# Patient Record
Sex: Male | Born: 1990 | Race: White | Hispanic: No | Marital: Single | Smoking: Never smoker
Health system: Southern US, Community
[De-identification: ages and names within clinical notes are randomized; demographics above are authoritative.]

## PROBLEM LIST (undated history)

## (undated) DIAGNOSIS — K5100 Ulcerative (chronic) pancolitis without complications: Secondary | ICD-10-CM

---

## 2017-12-30 ENCOUNTER — Other Ambulatory Visit: Payer: Self-pay

## 2017-12-30 ENCOUNTER — Emergency Department (HOSPITAL_COMMUNITY): Payer: Self-pay

## 2017-12-30 ENCOUNTER — Inpatient Hospital Stay (HOSPITAL_COMMUNITY)
Admission: EM | Admit: 2017-12-30 | Discharge: 2018-01-02 | DRG: 872 | Disposition: A | Discharge status: Home / Self Care | Payer: Self-pay | Attending: Internal Medicine | Admitting: Internal Medicine

## 2017-12-30 ENCOUNTER — Encounter (HOSPITAL_COMMUNITY): Payer: Self-pay

## 2017-12-30 DIAGNOSIS — E871 Hypo-osmolality and hyponatremia: Secondary | ICD-10-CM | POA: Diagnosis present

## 2017-12-30 DIAGNOSIS — K921 Melena: Secondary | ICD-10-CM | POA: Diagnosis not present

## 2017-12-30 DIAGNOSIS — D509 Iron deficiency anemia, unspecified: Secondary | ICD-10-CM | POA: Diagnosis present

## 2017-12-30 DIAGNOSIS — D649 Anemia, unspecified: Secondary | ICD-10-CM

## 2017-12-30 DIAGNOSIS — K5190 Ulcerative colitis, unspecified, without complications: Secondary | ICD-10-CM

## 2017-12-30 DIAGNOSIS — K5100 Ulcerative (chronic) pancolitis without complications: Secondary | ICD-10-CM

## 2017-12-30 DIAGNOSIS — A419 Sepsis, unspecified organism: Principal | ICD-10-CM | POA: Diagnosis present

## 2017-12-30 DIAGNOSIS — D473 Essential (hemorrhagic) thrombocythemia: Secondary | ICD-10-CM

## 2017-12-30 DIAGNOSIS — D75839 Thrombocytosis, unspecified: Secondary | ICD-10-CM

## 2017-12-30 DIAGNOSIS — E860 Dehydration: Secondary | ICD-10-CM | POA: Diagnosis present

## 2017-12-30 DIAGNOSIS — D62 Acute posthemorrhagic anemia: Secondary | ICD-10-CM | POA: Diagnosis present

## 2017-12-30 DIAGNOSIS — K51018 Ulcerative (chronic) pancolitis with other complication: Secondary | ICD-10-CM | POA: Diagnosis present

## 2017-12-30 HISTORY — DX: Ulcerative (chronic) pancolitis without complications: K51.00

## 2017-12-30 LAB — COMPREHENSIVE METABOLIC PANEL
ALBUMIN: 3.3 g/dL — AB (ref 3.5–5.0)
ALK PHOS: 89 U/L (ref 38–126)
ALT: 14 U/L (ref 0–44)
AST: 14 U/L — AB (ref 15–41)
Anion gap: 11 (ref 5–15)
BUN: 9 mg/dL (ref 6–20)
CALCIUM: 9.4 mg/dL (ref 8.9–10.3)
CHLORIDE: 100 mmol/L (ref 98–111)
CO2: 21 mmol/L — AB (ref 22–32)
CREATININE: 0.99 mg/dL (ref 0.61–1.24)
GFR calc non Af Amer: >60 mL/min (ref >60)
GLUCOSE: 103 mg/dL — AB (ref 70–99)
Potassium: 4.3 mmol/L (ref 3.5–5.1)
SODIUM: 132 mmol/L — AB (ref 135–145)
Total Bilirubin: 0.5 mg/dL (ref 0.3–1.2)
Total Protein: 9.4 g/dL — ABNORMAL HIGH (ref 6.5–8.1)

## 2017-12-30 LAB — CBC WITH DIFFERENTIAL/PLATELET
Abs Immature Granulocytes: 0 10*3/uL (ref 0.00–0.07)
BASOS ABS: 0 10*3/uL (ref 0.0–0.1)
Basophils Relative: 0 %
EOS ABS: 0 10*3/uL (ref 0.0–0.5)
EOS PCT: 0 %
HEMATOCRIT: 32 % — AB (ref 39.0–52.0)
HEMOGLOBIN: 8.2 g/dL — AB (ref 13.0–17.0)
LYMPHS ABS: 1.4 10*3/uL (ref 0.7–4.0)
Lymphocytes Relative: 8 %
MCH: 17.4 pg — ABNORMAL LOW (ref 26.0–34.0)
MCHC: 25.6 g/dL — ABNORMAL LOW (ref 30.0–36.0)
MCV: 67.9 fL — AB (ref 80.0–100.0)
MONOS PCT: 7 %
Monocytes Absolute: 1.2 10*3/uL — ABNORMAL HIGH (ref 0.1–1.0)
NRBC: 0 % (ref 0.0–0.2)
NRBC: 0 /100{WBCs}
Neutro Abs: 15.1 10*3/uL — ABNORMAL HIGH (ref 1.7–7.7)
Neutrophils Relative %: 85 %
Platelets: 1393 10*3/uL (ref 150–400)
RBC: 4.71 MIL/uL (ref 4.22–5.81)
RDW: 18.3 % — ABNORMAL HIGH (ref 11.5–15.5)
WBC: 17.8 10*3/uL — ABNORMAL HIGH (ref 4.0–10.5)

## 2017-12-30 LAB — URINALYSIS, ROUTINE W REFLEX MICROSCOPIC
Bilirubin Urine: NEGATIVE
GLUCOSE, UA: NEGATIVE mg/dL
Hgb urine dipstick: NEGATIVE
Ketones, ur: 5 mg/dL — AB
LEUKOCYTES UA: NEGATIVE
NITRITE: NEGATIVE
PH: 5 (ref 5.0–8.0)
Protein, ur: NEGATIVE mg/dL
Specific Gravity, Urine: >1.046 — ABNORMAL HIGH (ref 1.005–1.030)

## 2017-12-30 LAB — C-REACTIVE PROTEIN: CRP: 19.8 mg/dL — ABNORMAL HIGH (ref <1.0)

## 2017-12-30 LAB — POC OCCULT BLOOD, ED: Fecal Occult Bld: NEGATIVE

## 2017-12-30 LAB — I-STAT CG4 LACTIC ACID, ED: Lactic Acid, Venous: 1.51 mmol/L (ref 0.5–1.9)

## 2017-12-30 MED ORDER — IOHEXOL 300 MG/ML  SOLN
100.0000 mL | Freq: Once | INTRAMUSCULAR | Status: AC | PRN
  Administered 2017-12-30: 100 mL via INTRAVENOUS

## 2017-12-30 MED ORDER — LACTATED RINGERS IV BOLUS
1000.0000 mL | Freq: Once | INTRAVENOUS | Status: AC
  Administered 2017-12-30: 1000 mL via INTRAVENOUS

## 2017-12-30 MED ORDER — ONDANSETRON HCL 4 MG/2ML IJ SOLN
4.0000 mg | Freq: Once | INTRAMUSCULAR | Status: AC
  Administered 2017-12-30: 4 mg via INTRAVENOUS
  Filled 2017-12-30: qty 2

## 2017-12-30 MED ORDER — FERROUS SULFATE 325 (65 FE) MG PO TABS
325.0000 mg | ORAL_TABLET | Freq: Every day | ORAL | Status: DC
  Administered 2017-12-31: 325 mg via ORAL
  Filled 2017-12-30: qty 1

## 2017-12-30 MED ORDER — SODIUM CHLORIDE 0.9 % IV SOLN
INTRAVENOUS | Status: AC
  Administered 2017-12-30: 23:00:00 via INTRAVENOUS

## 2017-12-30 MED ORDER — ACETAMINOPHEN 650 MG RE SUPP: 650.0000 mg | Freq: Four times a day (QID) | RECTAL | Status: DC | PRN

## 2017-12-30 MED ORDER — ENOXAPARIN SODIUM 40 MG/0.4ML ~~LOC~~ SOLN
40.0000 mg | SUBCUTANEOUS | Status: DC
  Administered 2017-12-30 – 2018-01-01 (×3): 40 mg via SUBCUTANEOUS
  Filled 2017-12-30 (×3): qty 0.4

## 2017-12-30 MED ORDER — PIPERACILLIN-TAZOBACTAM 3.375 G IVPB
3.3750 g | Freq: Three times a day (TID) | INTRAVENOUS | Status: AC
  Administered 2017-12-30 – 2018-01-01 (×7): 3.375 g via INTRAVENOUS
  Filled 2017-12-30 (×7): qty 50

## 2017-12-30 MED ORDER — METHYLPREDNISOLONE SODIUM SUCC 40 MG IJ SOLR
40.0000 mg | Freq: Two times a day (BID) | INTRAMUSCULAR | Status: DC
  Administered 2017-12-30 – 2018-01-01 (×4): 40 mg via INTRAVENOUS
  Filled 2017-12-30 (×4): qty 1

## 2017-12-30 MED ORDER — PIPERACILLIN-TAZOBACTAM 4.5 G IVPB
4.5000 g | Freq: Once | INTRAVENOUS | Status: AC
  Administered 2017-12-30: 4.5 g via INTRAVENOUS
  Filled 2017-12-30: qty 100

## 2017-12-30 MED ORDER — MULTIVITAL PO TABS: ORAL_TABLET | Freq: Every day | ORAL | Status: DC

## 2017-12-30 MED ORDER — HYDROMORPHONE HCL 1 MG/ML IJ SOLN: 1.0000 mg | Freq: Once | INTRAMUSCULAR | Status: DC

## 2017-12-30 MED ORDER — ACETAMINOPHEN 325 MG PO TABS: 650.0000 mg | ORAL_TABLET | Freq: Four times a day (QID) | ORAL | Status: DC | PRN

## 2017-12-30 MED ORDER — FENTANYL CITRATE (PF) 100 MCG/2ML IJ SOLN
50.0000 ug | Freq: Once | INTRAMUSCULAR | Status: AC
  Administered 2017-12-30: 50 ug via INTRAVENOUS
  Filled 2017-12-30: qty 2

## 2017-12-30 NOTE — H&P (Signed)
History and Physical    [name] FYT:[id number] DOB: 01-Sep-1990 DOA: 12/30/2017  PCP: Patient, No Pcp Per Patient coming from: Home  Chief Complaint: Abdominal pain  HPI: Richard Estrada is a 27 y.o. male with medical history significant of ulcerative colitis presenting to the hospital for evaluation of abdominal pain.  Patient states he recently moved to [place] from [place] about 2.5 weeks ago.  He has previously seen gastroenterology in [place] and taken mesalamine in the past.  Currently not on any treatment for his ulcerative colitis.  He is now presenting with a 2 to 3-day history of left lower quadrant abdominal pain.  States the pain is dull and intermittently stabbing when he lays down or changes position in bed.  Currently his pain level is 2 out of 10.  He denies having any fevers, chills, nausea, or vomiting.  Denies having any melena or hematochezia.  States he is able to tolerate p.o. intake.  Denies having any fevers, sweats, unintentional weight loss, or fatigue.  Denies having any flushing, pruritus, or symptoms of erythromelalgia.  Denies history of blood clots.  He is not a smoker.  Reports having chronic anemia for which he takes an iron supplement.  ED Course: Temperature 100.7 F, tachycardic, tachypneic, blood pressure stable, and satting well on room air.  White count 17.8.  Lactic acid normal.  Hemoglobin 8.2 with MCV 67.  FOBT negative.  Platelet count 1,393,000.  Sodium 132.  LFTs normal.  UA not suggestive of infection. Urine culture pending.  Blood culture x2 pending.  CT abdomen pelvis showing pancolitis and mild terminal ileitis from infectious versus inflammatory etiology.  Pancolitis is most severe in the descending and sigmoid colon with severe surrounding inflammatory changes.  Chest x-ray showing no active disease.  Review of Systems: As per HPI otherwise 10 point review of systems negative.  Past Medical History:  Diagnosis Date  . Ulcerative  (chronic) enterocolitis ([place])     History reviewed. No pertinent surgical history.   reports that he has never smoked. He has never used smokeless tobacco. He reports that he drinks alcohol. He reports that he does not use drugs.  No Known Allergies  No family history on file.  Prior to Admission medications   Medication Sig Start Date End Date Taking? Authorizing Provider  IRON PO Take 1 tablet by mouth daily.   Yes [provider]  Multiple Vitamins-Minerals (MULTIVITAL PO) Take 1 tablet by mouth daily.   Yes [provider]    Physical Exam: Vitals:   12/30/17 1700 12/30/17 1804 12/30/17 2112 12/31/17 0038  BP: 110/73 119/80 118/75 107/64  Pulse: (!) 122 (!) 116 (!) 109 (!) 106  Resp: 18 (!) _0 Temp:  99.8 F (37.7 C) 98.4 F (36.9 C) 99.1 F (37.3 C)  TempSrc:  Oral Oral Oral  SpO2: 100% 99% 100% 99%  Weight:   67.2 kg   Height:   6' (1.829 m)     Physical Exam  Constitutional: He is oriented to person, place, and time. He appears well-developed and well-nourished. No distress.  HENT:  Head: Normocephalic.  Moist mucous membranes  Eyes: Right eye exhibits no discharge. Left eye exhibits no discharge.  Neck: Neck supple. No tracheal deviation present.  Cardiovascular: Normal rate, regular rhythm and intact distal pulses.  Pulmonary/Chest: Effort normal and breath sounds normal. No respiratory distress. He has no wheezes. He has no rales.  Abdominal: Soft. Bowel sounds are normal. He exhibits no distension.  There is tenderness. There is no rebound and no guarding.  Left lower quadrant mildly tender to palpation  Musculoskeletal: He exhibits no edema.  Neurological: He is alert and oriented to person, place, and time.  Skin: Skin is warm and dry. He is not diaphoretic.  Psychiatric: He has a normal mood and affect. His behavior is normal.     Labs on Admission: I have personally reviewed following labs and imaging studies  CBC: Recent  Labs  Lab 12/30/17 1556  WBC 17.8*  NEUTROABS 15.1*  HGB 8.2*  HCT 32.0*  MCV 67.9*  PLT 8,295*   Basic Metabolic Panel: Recent Labs  Lab 12/30/17 1556  NA 132*  K 4.3  CL 100  CO2 21*  GLUCOSE 103*  BUN 9  CREATININE 0.99  CALCIUM 9.4   GFR: Estimated Creatinine Clearance: 107.5 mL/min (by C-G formula based on SCr of 0.99 mg/dL). Liver Function Tests: Recent Labs  Lab 12/30/17 1556  AST 14*  ALT 14  ALKPHOS 89  BILITOT 0.5  PROT 9.4*  ALBUMIN 3.3*   No results for input(s): LIPASE, AMYLASE in the last 168 hours. No results for input(s): AMMONIA in the last 168 hours. Coagulation Profile: No results for input(s): INR, PROTIME in the last 168 hours. Cardiac Enzymes: No results for input(s): CKTOTAL, CKMB, CKMBINDEX, TROPONINI in the last 168 hours. BNP (last 3 results) No results for input(s): PROBNP in the last 8760 hours. HbA1C: No results for input(s): HGBA1C in the last 72 hours. CBG: No results for input(s): GLUCAP in the last 168 hours. Lipid Profile: No results for input(s): CHOL, HDL, LDLCALC, TRIG, CHOLHDL, LDLDIRECT in the last 72 hours. Thyroid Function Tests: No results for input(s): TSH, T4TOTAL, FREET4, T3FREE, THYROIDAB in the last 72 hours. Anemia Panel: No results for input(s): VITAMINB12, FOLATE, FERRITIN, TIBC, IRON, RETICCTPCT in the last 72 hours. Urine analysis:    Component Value Date/Time   COLORURINE YELLOW 12/30/2017 2246   APPEARANCEUR CLEAR 12/30/2017 2246   LABSPEC >1.046 (H) 12/30/2017 2246   PHURINE 5.0 12/30/2017 2246   GLUCOSEU NEGATIVE 12/30/2017 2246   HGBUR NEGATIVE 12/30/2017 2246   BILIRUBINUR NEGATIVE 12/30/2017 2246   KETONESUR 5 (A) 12/30/2017 2246   PROTEINUR NEGATIVE 12/30/2017 2246   NITRITE NEGATIVE 12/30/2017 2246   LEUKOCYTESUR NEGATIVE 12/30/2017 2246    Radiological Exams on Admission: Ct Abdomen Pelvis W Contrast  Result Date: 12/30/2017 CLINICAL DATA:  Cough for 1 week EXAM: CT ABDOMEN AND  PELVIS WITH CONTRAST TECHNIQUE: Multidetector CT imaging of the abdomen and pelvis was performed using the standard protocol following bolus administration of intravenous contrast. CONTRAST:  191m OMNIPAQUE IOHEXOL 300 MG/ML  SOLN COMPARISON:  None. FINDINGS: Lower chest: No acute abnormality. Hepatobiliary: No focal liver abnormality is seen. No gallstones, gallbladder wall thickening, or biliary dilatation. Pancreas: Unremarkable. No pancreatic ductal dilatation or surrounding inflammatory changes. Spleen: Normal in size without focal abnormality. Adrenals/Urinary Tract: Adrenal glands are unremarkable. Kidneys are normal, without renal calculi, focal lesion, or hydronephrosis. Bladder is unremarkable. Stomach/Bowel: Normal stomach. No pneumatosis, pneumoperitoneum or portal venous gas. Severe bowel wall thickening involving the descending and sigmoid colon with surrounding inflammatory changes. Bowel wall thickening involving the remainder of the colon. Air-fluid levels within the colon. Mild bowel wall thickening and distention of the terminal ileum. Mild thickening of appendix likely reactive. Vascular/Lymphatic: No significant vascular findings are present. No enlarged abdominal or pelvic lymph nodes. Reproductive: Prostate is unremarkable. Other: No abdominal wall hernia or abnormality. Small volume perihepatic ascites.  Musculoskeletal: No acute osseous abnormality. No aggressive osseous lesion. IMPRESSION: 1. Pancolitis and mild terminal ileitis as can be seen with an infectious or inflammatory etiology. Pancolitis is most severe in the descending and sigmoid colon with severe surrounding inflammatory changes. Electronically Signed   By: Kathreen Devoid   On: 12/30/2017 18:19   Dg Chest Portable 1 View  Result Date: 12/30/2017 CLINICAL DATA:  Nausea.  Fever. EXAM: PORTABLE CHEST 1 VIEW COMPARISON:  None. FINDINGS: The heart size and mediastinal contours are within normal limits. Both lungs are clear. The  visualized skeletal structures are unremarkable. IMPRESSION: No active disease. Electronically Signed   By: Kerby Moors M.D.   On: 12/30/2017 16:48    EKG: Independently reviewed.  Sinus tachycardia (heart rate 146).  Assessment/Plan Principal Problem:   Sepsis ([place]) Active Problems:   Inflammatory bowel disease (ulcerative colitis) (HCC)   Thrombocytosis ([facility])   Hyponatremia   Chronic anemia   Sepsis secondary to IBD flare -Patient has a history of ulcerative colitis and is currently not on treatment.  He recently moved from [place] and does not have a gastroenterologist here. -On arrival, temperature 100.7 F, tachycardic, tachypneic, blood pressure stable, and satting well on room air.  Heart rate improved with IV fluid.  He is no longer tachypneic.  -White count 17.8.  Lactic acid normal. LFTs normal.  ESR and CRP elevated.  CT abdomen pelvis showing pancolitis and mild terminal ileitis from infectious versus inflammatory etiology.  Pancolitis is most severe in the descending and sigmoid colon with severe surrounding inflammatory changes. -I have discussed the patient's case with Dr. Therisa Doyne from [place] who recommended checking ESR, CRP, C. difficile panel, GI pathogen panel, and starting the patient on Solu-Medrol 40 mg every 12 hours.  Recommended continuing Zosyn. GI will see the patient in the morning. -Zosyn -IV Solu-Medrol 40 mg every 12 hours -Tylenol PRN -IV fluid hydration -Clear liquid diet -C. difficile panel -GI pathogen panel -Repeat CBC in a.m. -Blood culture x2 pending  Severe thrombocytosis Platelet count 1,393.  ESR and CRP elevated.  Thrombocytosis possibly reactive due to inflammation. Patient denies having any fevers, sweats, unintentional weight loss, or fatigue.  Denies having any flushing, pruritus, or symptoms of erythromelalgia.  Denies history of blood clots.  He is not a smoker. -Repeat CBC in a.m. -Blood smear  Mild hyponatremia Sodium 132.   Likely due to poor oral intake. -IV fluid hydration -Repeat BMP in a.m.  Chronic anemia Hemoglobin 8.2 with MCV 67.  Denies having any melena or hematochezia. FOBT negative.  Patient reports having chronic anemia for which he takes an iron supplement.   -Continue home iron supplement -Check iron, ferritin, TIBC -CBC in a.m.  DVT prophylaxis: Lovenox Code Status: Full code Family Communication: Mother at bedside. Disposition Plan: Anticipate discharge in 2 to 3 days. Consults called: [facility] (Dr. Therisa Doyne) Admission status: It is my clinical opinion that admission to INPATIENT is reasonable and necessary in this 27 y.o. male . presenting with symptoms of abdominal pain, tachycardia, fever, concerning for sepsis secondary to IBD flare . in the context of PMH including: Ulcerative colitis . with pertinent positives on physical exam including: Tachycardia, abdominal pain . and pertinent positives on radiographic and laboratory data including: Leukocytosis, evidence of pancolitis on CT . Workup and treatment include IV antibiotic, IV steroid.  GI evaluation pending.  Given the aforementioned, the predictability of an adverse outcome is felt to be significant. I expect that the patient will require at  least 2 midnights in the hospital to treat this condition.    Shela Leff MD [facility] Pager [phone]  If 7PM-7AM, please contact night-coverage [email or web address] Password [email or web address]  12/31/2017, 1:21 AM

## 2017-12-30 NOTE — ED Provider Notes (Signed)
[facility] EMERGENCY DEPARTMENT Provider Note   CSN: [id number] Arrival date & time: 12/30/17  1530     History   Chief Complaint Chief Complaint  Patient presents with  . Abdominal Pain    HPI Richard Estrada is a 27 y.o. male.  The history is provided by the patient.  Abdominal Pain   This is a new problem. The current episode started yesterday. The problem occurs constantly. The problem has been gradually worsening. The pain is associated with an unknown (Hx of UC, not on any medications, recently moved here and over last two days worsening LLQ abdominal pain) factor. The pain is located in the LLQ. The quality of the pain is throbbing. The pain is at a severity of 9/10. The pain is severe. Associated symptoms include diarrhea. Pertinent negatives include anorexia, fever, belching, flatus, hematochezia, melena, nausea, vomiting, constipation, dysuria, frequency, hematuria, headaches, arthralgias and myalgias. The symptoms are aggravated by palpation. Nothing relieves the symptoms. Past workup includes GI consult. His past medical history is significant for ulcerative colitis.    Past Medical History:  Diagnosis Date  . Ulcerative (chronic) enterocolitis (H[facility])     There are no active problems to display for this patient.   History reviewed. No pertinent surgical history.      Home Medications    Prior to Admission medications   Medication Sig Start Date End Date Taking? Authorizing Provider  IRON PO Take 1 tablet by mouth daily.   Yes [provider]  Multiple Vitamins-Minerals (MULTIVITAL PO) Take 1 tablet by mouth daily.   Yes [provider]    Family History No family history on file.  Social History Social History   Tobacco Use  . Smoking status: Never Smoker  . Smokeless tobacco: Never Used  Substance Use Topics  . Alcohol use: Yes  . Drug use: Never     Allergies   Patient has no known allergies.   Review of  Systems Review of Systems  Constitutional: Negative for chills and fever.  HENT: Negative for ear pain and sore throat.   Eyes: Negative for pain and visual disturbance.  Respiratory: Negative for cough and shortness of breath.   Cardiovascular: Negative for chest pain and palpitations.  Gastrointestinal: Positive for abdominal pain and diarrhea. Negative for anorexia, constipation, flatus, hematochezia, melena, nausea and vomiting.  Genitourinary: Negative for dysuria, frequency and hematuria.  Musculoskeletal: Negative for arthralgias, back pain and myalgias.  Skin: Negative for color change and rash.  Neurological: Negative for seizures, syncope and headaches.  All other systems reviewed and are negative.    Physical Exam Updated Vital Signs  ED Triage Vitals  Enc Vitals Group     BP 12/30/17 1538 120/84     Pulse Rate 12/30/17 1538 (!) 142     Resp 12/30/17 1538 (!) 22     Temp 12/30/17 1538 (!) 100.7 F (38.2 C)     Temp Source 12/30/17 1538 Oral     SpO2 12/30/17 1538 100 %     Weight 12/30/17 1543 152 lb (68.9 kg)     Height 12/30/17 1543 6' (1.829 m)     Head Circumference --      Peak Flow --      Pain Score 12/30/17 1543 4     Pain Loc --      Pain Edu? --      Excl. in [place]? --     Physical Exam  Constitutional: He appears well-developed and well-nourished.  HENT:  Head: Normocephalic and atraumatic.  Mouth/Throat: No oropharyngeal exudate.  Eyes: Conjunctivae and EOM are normal.  Neck: Neck supple.  Cardiovascular: Normal rate, regular rhythm, normal heart sounds and intact distal pulses.  No murmur heard. Pulmonary/Chest: Effort normal and breath sounds normal. No respiratory distress.  Abdominal: Soft. There is tenderness in the left upper quadrant and left lower quadrant. There is guarding.  Musculoskeletal: He exhibits no edema.  Neurological: He is alert.  Skin: Skin is warm and dry.  Psychiatric: He has a normal mood and affect.  Nursing note and  vitals reviewed.    ED Treatments / Results  Labs (all labs ordered are listed, but only abnormal results are displayed) Labs Reviewed  COMPREHENSIVE METABOLIC PANEL - Abnormal; Notable for the following components:      Result Value   Sodium 132 (*)    CO2 21 (*)    Glucose, Bld 103 (*)    Total Protein 9.4 (*)    Albumin 3.3 (*)    AST 14 (*)    All other components within normal limits  CBC WITH DIFFERENTIAL/PLATELET - Abnormal; Notable for the following components:   WBC 17.8 (*)    Hemoglobin 8.2 (*)    HCT 32.0 (*)    MCV 67.9 (*)    MCH 17.4 (*)    MCHC 25.6 (*)    RDW 18.3 (*)    Platelets 1,393 (*)    Neutro Abs 15.1 (*)    Monocytes Absolute 1.2 (*)    All other components within normal limits  CULTURE, BLOOD (ROUTINE X 2)  CULTURE, BLOOD (ROUTINE X 2)  URINE CULTURE  URINALYSIS, ROUTINE W REFLEX MICROSCOPIC  I-STAT CG4 LACTIC ACID, ED  POC OCCULT BLOOD, ED    EKG None  Radiology Ct Abdomen Pelvis W Contrast  Result Date: 12/30/2017 CLINICAL DATA:  Cough for 1 week EXAM: CT ABDOMEN AND PELVIS WITH CONTRAST TECHNIQUE: Multidetector CT imaging of the abdomen and pelvis was performed using the standard protocol following bolus administration of intravenous contrast. CONTRAST:  136mL OMNIPAQUE IOHEXOL 300 MG/ML  SOLN COMPARISON:  None. FINDINGS: Lower chest: No acute abnormality. Hepatobiliary: No focal liver abnormality is seen. No gallstones, gallbladder wall thickening, or biliary dilatation. Pancreas: Unremarkable. No pancreatic ductal dilatation or surrounding inflammatory changes. Spleen: Normal in size without focal abnormality. Adrenals/Urinary Tract: Adrenal glands are unremarkable. Kidneys are normal, without renal calculi, focal lesion, or hydronephrosis. Bladder is unremarkable. Stomach/Bowel: Normal stomach. No pneumatosis, pneumoperitoneum or portal venous gas. Severe bowel wall thickening involving the descending and sigmoid colon with surrounding  inflammatory changes. Bowel wall thickening involving the remainder of the colon. Air-fluid levels within the colon. Mild bowel wall thickening and distention of the terminal ileum. Mild thickening of appendix likely reactive. Vascular/Lymphatic: No significant vascular findings are present. No enlarged abdominal or pelvic lymph nodes. Reproductive: Prostate is unremarkable. Other: No abdominal wall hernia or abnormality. Small volume perihepatic ascites. Musculoskeletal: No acute osseous abnormality. No aggressive osseous lesion. IMPRESSION: 1. Pancolitis and mild terminal ileitis as can be seen with an infectious or inflammatory etiology. Pancolitis is most severe in the descending and sigmoid colon with severe surrounding inflammatory changes. Electronically Signed   By: Kathreen Devoid   On: 12/30/2017 18:19   Dg Chest Portable 1 View  Result Date: 12/30/2017 CLINICAL DATA:  Nausea.  Fever. EXAM: PORTABLE CHEST 1 VIEW COMPARISON:  None. FINDINGS: The heart size and mediastinal contours are within normal limits. Both lungs are clear. The visualized  skeletal structures are unremarkable. IMPRESSION: No active disease. Electronically Signed   By: Kerby Moors M.D.   On: 12/30/2017 16:48    Procedures .Critical Care Performed by: Lennice Sites, DO Authorized by: Lennice Sites, DO   Critical care provider statement:    Critical care time (minutes):  40   Critical care time was exclusive of:  Separately billable procedures and treating other patients and teaching time   Critical care was necessary to treat or prevent imminent or life-threatening deterioration of the following conditions:  Sepsis   Critical care was time spent personally by me on the following activities:  Development of treatment plan with patient or surrogate, discussions with primary provider, evaluation of patient's response to treatment, examination of patient, interpretation of cardiac output measurements, obtaining history from  patient or surrogate, ordering and performing treatments and interventions, ordering and review of laboratory studies, pulse oximetry, re-evaluation of patient's condition and ordering and review of radiographic studies   I assumed direction of critical care for this patient from another provider in my specialty: no     (including critical care time)  Medications Ordered in ED Medications  HYDROmorphone (DILAUDID) injection 1 mg (1 mg Intravenous Not Given 12/30/17 1804)  lactated ringers bolus 1,000 mL (0 mLs Intravenous Stopped 12/30/17 1846)  fentaNYL (SUBLIMAZE) injection 50 mcg (50 mcg Intravenous Given 12/30/17 1558)  piperacillin-tazobactam (ZOSYN) IVPB 4.5 g (0 g Intravenous Stopped 12/30/17 1658)  ondansetron (ZOFRAN) injection 4 mg (4 mg Intravenous Given 12/30/17 1618)  lactated ringers bolus 1,000 mL (1,000 mLs Intravenous New Bag/Given 12/30/17 1804)  iohexol (OMNIPAQUE) 300 MG/ML solution 100 mL (100 mLs Intravenous Contrast Given 12/30/17 1744)     Initial Impression / Assessment and Plan / ED Course  I have reviewed the triage vital signs and the nursing notes.  Pertinent labs & imaging results that were available during my care of the patient were reviewed by me and considered in my medical decision making (see chart for details).     Melvyn Hommes is a 27 year old male with history of ulcerative colitis who presents to the ED with left lower quadrant abdominal pain.  Patient with fever and tachycardia upon arrival.  Normal blood pressure.  Patient with pain over the last several days.  Recently moved here from [place] and does not have a primary care physician or GI doctor.  He is not currently on any medications but has been on mesalamine and prednisone in the past.  Patient states that pain worse over the last several days.  Concern for sepsis upon arrival given fever and tachycardia.  Patient is exquisitely tender in the left lower quadrant on exam.  No obvious  peritonitis.  Sepsis work-up initiated with blood cultures, urine studies, chest x-ray, CT abdomen and pelvis, IV fluids, IV Zosyn.  Suspect likely infectious colitis versus abscess versus perforation.  Patient with pancolitis found on CT scan with the most severe of inflammation in his sigmoid area.  No abscess, no perforation.  Patient with significant leukocytosis, normal lactic acid.  Patient with hemoglobin of 8.5 but patient states he is anemic at baseline and takes iron pills.  No gross melena or hematochezia on rectal exam.  Occult was negative for blood.  Patient with thrombocytosis possibly from acute phase reactant from infectious process.  Patient had improvement in vitals following IV fluids, IV pain medicine, IV antibiotics.  Chest x-ray showed no signs of pneumonia.  Urinalysis pending.  Patient to be admitted to hospitalist service for further  sepsis care.   This chart was dictated using voice recognition software.  Despite best efforts to proofread,  errors can occur which can change the documentation meaning.   Final Clinical Impressions(s) / ED Diagnoses   Final diagnoses:  Sepsis, due to unspecified organism, unspecified whether acute organ dysfunction present ([place])  Pancolitis ([place])  Thrombocytosis [facility])    ED Discharge Orders    None       Lennice Sites, DO 12/30/17 1923

## 2017-12-30 NOTE — ED Triage Notes (Signed)
Pt c/o lower left ABD pain. Pt has hx of ulcerative colitis. Pt endorse nausea.

## 2017-12-30 NOTE — ED Notes (Signed)
Patient aware we need urine sample.  

## 2017-12-31 ENCOUNTER — Inpatient Hospital Stay (HOSPITAL_COMMUNITY): Payer: Self-pay

## 2017-12-31 DIAGNOSIS — E871 Hypo-osmolality and hyponatremia: Secondary | ICD-10-CM

## 2017-12-31 DIAGNOSIS — D473 Essential (hemorrhagic) thrombocythemia: Secondary | ICD-10-CM

## 2017-12-31 DIAGNOSIS — K5190 Ulcerative colitis, unspecified, without complications: Secondary | ICD-10-CM

## 2017-12-31 DIAGNOSIS — D75839 Thrombocytosis, unspecified: Secondary | ICD-10-CM

## 2017-12-31 DIAGNOSIS — D649 Anemia, unspecified: Secondary | ICD-10-CM

## 2017-12-31 LAB — BASIC METABOLIC PANEL
ANION GAP: 12 (ref 5–15)
BUN: 9 mg/dL (ref 6–20)
CALCIUM: 8.7 mg/dL — AB (ref 8.9–10.3)
CO2: 20 mmol/L — ABNORMAL LOW (ref 22–32)
Chloride: 100 mmol/L (ref 98–111)
Creatinine, Ser: 1.05 mg/dL (ref 0.61–1.24)
Glucose, Bld: 136 mg/dL — ABNORMAL HIGH (ref 70–99)
Potassium: 3.8 mmol/L (ref 3.5–5.1)
SODIUM: 132 mmol/L — AB (ref 135–145)

## 2017-12-31 LAB — PREPARE RBC (CROSSMATCH)

## 2017-12-31 LAB — C DIFFICILE QUICK SCREEN W PCR REFLEX
C DIFFICILE (CDIFF) INTERP: NOT DETECTED
C Diff antigen: NEGATIVE
C Diff toxin: NEGATIVE

## 2017-12-31 LAB — GASTROINTESTINAL PANEL BY PCR, STOOL (REPLACES STOOL CULTURE)
ADENOVIRUS F40/41: NOT DETECTED
Astrovirus: NOT DETECTED
CAMPYLOBACTER SPECIES: NOT DETECTED
Cryptosporidium: NOT DETECTED
Cyclospora cayetanensis: NOT DETECTED
ENTEROPATHOGENIC E COLI (EPEC): NOT DETECTED
ENTEROTOXIGENIC E COLI (ETEC): NOT DETECTED
Entamoeba histolytica: NOT DETECTED
Enteroaggregative E coli (EAEC): NOT DETECTED
Giardia lamblia: NOT DETECTED
NOROVIRUS GI/GII: NOT DETECTED
PLESIMONAS SHIGELLOIDES: NOT DETECTED
ROTAVIRUS A: NOT DETECTED
SHIGELLA/ENTEROINVASIVE E COLI (EIEC): NOT DETECTED
Salmonella species: NOT DETECTED
Sapovirus (I, II, IV, and V): NOT DETECTED
Shiga like toxin producing E coli (STEC): NOT DETECTED
Vibrio cholerae: NOT DETECTED
Vibrio species: NOT DETECTED
YERSINIA ENTEROCOLITICA: NOT DETECTED

## 2017-12-31 LAB — CBC
HEMATOCRIT: 23.7 % — AB (ref 39.0–52.0)
HEMOGLOBIN: 6.1 g/dL — AB (ref 13.0–17.0)
MCH: 17.2 pg — ABNORMAL LOW (ref 26.0–34.0)
MCHC: 25.7 g/dL — AB (ref 30.0–36.0)
MCV: 66.9 fL — ABNORMAL LOW (ref 80.0–100.0)
NRBC: 0 % (ref 0.0–0.2)
Platelets: 957 10*3/uL (ref 150–400)
RBC: 3.54 MIL/uL — AB (ref 4.22–5.81)
RDW: 17.8 % — AB (ref 11.5–15.5)
WBC: 11 10*3/uL — ABNORMAL HIGH (ref 4.0–10.5)

## 2017-12-31 LAB — IRON AND TIBC
Iron: 8 ug/dL — ABNORMAL LOW (ref 45–182)
Saturation Ratios: 3 % — ABNORMAL LOW (ref 17.9–39.5)
TIBC: 294 ug/dL (ref 250–450)
UIBC: 286 ug/dL

## 2017-12-31 LAB — FERRITIN: FERRITIN: 28 ng/mL (ref 24–336)

## 2017-12-31 LAB — SAVE SMEAR (SSMR)

## 2017-12-31 LAB — ABO/RH: ABO/RH(D): A POS

## 2017-12-31 LAB — HIV ANTIBODY (ROUTINE TESTING W REFLEX): HIV Screen 4th Generation wRfx: NONREACTIVE

## 2017-12-31 LAB — PATHOLOGIST SMEAR REVIEW

## 2017-12-31 LAB — SEDIMENTATION RATE: SED RATE: 93 mm/h — AB (ref 0–16)

## 2017-12-31 MED ORDER — DEXTROSE-NACL 5-0.9 % IV SOLN: INTRAVENOUS | Status: DC

## 2017-12-31 MED ORDER — SODIUM CHLORIDE 0.9% IV SOLUTION
Freq: Once | INTRAVENOUS | Status: AC
  Administered 2017-12-31: 17:00:00 via INTRAVENOUS

## 2017-12-31 MED ORDER — SODIUM CHLORIDE 0.9 % IV SOLN
INTRAVENOUS | Status: DC | PRN
  Administered 2017-12-31 – 2018-01-01 (×2): 1000 mL via INTRAVENOUS

## 2017-12-31 NOTE — Plan of Care (Signed)
  Problem: Education: Goal: Knowledge of General Education information will improve Description Including pain rating scale, medication(s)/side effects and non-pharmacologic comfort measures Outcome: Progressing   Problem: Health Behavior/Discharge Planning: Goal: Ability to manage health-related needs will improve Outcome: Progressing   

## 2017-12-31 NOTE — Progress Notes (Signed)
PROGRESS NOTE    Richard Estrada  PJK:[id number] DOB: 08-21-1990 DOA: 12/30/2017 PCP: Patient, No Pcp Per    Brief Narrative:  27 year old male who presented with abdominal pain.  He does have significant past medical history for ulcerative colitis.  The day history of left lower abdominal pain, sharp and sharp in nature, intermittent, worse with supine position or movement.  No melena or hematochezia.  Patient not on any treatment for ulcerative colitis.  Physical examination he was febrile 100.7 F, pressure 110/73, heart rate 122, respiratory 23, oxygen saturation 99%.  Moist mucous membranes, lungs clear to auscultation bilaterally, heart S1-S2 present and rhythmic, abdomen tender in the left lower quadrant, no rebound or guarding, no lower extremity edema.  Sodium 132, potassium 4.3, chloride 100, bicarb 21, glucose 103, BUN 9, creatinine 0.99, white count 17.8, hemoglobin 8.2, hematocrit 32.0, platelets 1393.  Urinalysis negative for infection.  C. difficile negative.  CT of the abdomen with pancolitis and mild terminal ileitis.  More severe in the descending and sigmoid colon.  Chest x-ray negative for infiltrates.  EKG sinus tachycardia 146 bpm.  Patient was admitted to the hospital with a working diagnosis of sepsis due to colitis, ulcerative colitis flare.   Assessment & Plan:   Principal Problem:   Sepsis ([place]) Active Problems:   Inflammatory bowel disease (ulcerative colitis) ([place])   Thrombocytosis (HCC)   Hyponatremia   Chronic anemia   1.  Acute colitis, ulcerative colitis flare complicated with sepsis. Will continue systemic steroids, as needed analgesics and antiemetics. Will continue gentle hydration with isotonic fluids, for now will continue antibiotic therapy with IV Zosyn, will de-escalate likely in am . Case discussed with Dr. Paulita Fujita.   2.  Thrombocytosis. Suspected to be reactive, combined with iron deficiency.   3.  Hyponatremia. Due to dehydration, will continue  gentle hydration with isotonic saline, will follow on renal panel in am. Preserved renal function with serum cr at 1,05. Serum bicarbonate at 20, cw non gap metabolic acidosis.   4.  Acute blood loss anemia on chronic anemia of iron deficiency. Critical hb today, down to 6,1, will transfuse 2 units PRBC and will follow H&H in am. Follow on iron panel. Iron 8, with TIBC 294 and ferritin 28 with transferrin saturation 3. Will give IV iron before discharge.   DVT prophylaxis: scd   Code Status:  full Family Communication: no family at the bedside  Disposition Plan/ discharge barriers: home pending clinical improvement.   Body mass index is 19.95 kg/m. Malnutrition Type:      Malnutrition Characteristics:      Nutrition Interventions:     RN Pressure Injury Documentation:     Consultants:   GI   Procedures:     Antimicrobials:   IV zosyn     Subjective: Patient has improved but not at baseline, no nausea or vomiting, no chest pain or dyspnea.   Objective: Vitals:   12/31/17 0038 12/31/17 0602 12/31/17 1022 12/31/17 1220  BP: 107/64 106/69 108/74 111/81  Pulse: (!) 106 80 71 90  Resp: 18 18 16 16   Temp: 99.1 F (37.3 C) 97.9 F (36.6 C) 97.7 F (36.5 C) 97.8 F (36.6 C)  TempSrc: Oral Oral Oral Oral  SpO2: 99% 100% 100% 100%  Weight:  66.7 kg    Height:        Intake/Output Summary (Last 24 hours) at 12/31/2017 1301 Last data filed at 12/31/2017 1224 Gross per 24 hour  Intake 2205.82 ml  Output 550  ml  Net 1655.82 ml   Filed Weights   12/30/17 1543 12/30/17 2112 12/31/17 0602  Weight: 68.9 kg 67.2 kg 66.7 kg    Examination:   General: deconditioned  Neurology: Awake and alert, non focal  E ENT: mild pallor, no icterus, oral mucosa moist Cardiovascular: No JVD. S1-S2 present, rhythmic, no gallops, rubs, or murmurs. No lower extremity edema. Pulmonary: vesicular breath sounds bilaterally, adequate air movement, no wheezing, rhonchi or  rales. Gastrointestinal. Abdomen mild distended with no organomegaly, non tender, no rebound or guarding Skin. No rashes Musculoskeletal: no joint deformities     Data Reviewed: I have personally reviewed following labs and imaging studies  CBC: Recent Labs  Lab 12/30/17 1556 12/31/17 0510  WBC 17.8* 11.0*  NEUTROABS 15.1*  --   HGB 8.2* 6.1*  HCT 32.0* 23.7*  MCV 67.9* 66.9*  PLT 1,393* 371*   Basic Metabolic Panel: Recent Labs  Lab 12/30/17 1556 12/31/17 0510  NA 132* 132*  K 4.3 3.8  CL 100 100  CO2 21* 20*  GLUCOSE 103* 136*  BUN 9 9  CREATININE 0.99 1.05  CALCIUM 9.4 8.7*   GFR: Estimated Creatinine Clearance: 100.6 mL/min (by C-G formula based on SCr of 1.05 mg/dL). Liver Function Tests: Recent Labs  Lab 12/30/17 1556  AST 14*  ALT 14  ALKPHOS 89  BILITOT 0.5  PROT 9.4*  ALBUMIN 3.3*   No results for input(s): LIPASE, AMYLASE in the last 168 hours. No results for input(s): AMMONIA in the last 168 hours. Coagulation Profile: No results for input(s): INR, PROTIME in the last 168 hours. Cardiac Enzymes: No results for input(s): CKTOTAL, CKMB, CKMBINDEX, TROPONINI in the last 168 hours. BNP (last 3 results) No results for input(s): PROBNP in the last 8760 hours. HbA1C: No results for input(s): HGBA1C in the last 72 hours. CBG: No results for input(s): GLUCAP in the last 168 hours. Lipid Profile: No results for input(s): CHOL, HDL, LDLCALC, TRIG, CHOLHDL, LDLDIRECT in the last 72 hours. Thyroid Function Tests: No results for input(s): TSH, T4TOTAL, FREET4, T3FREE, THYROIDAB in the last 72 hours. Anemia Panel: Recent Labs    12/31/17 0510  FERRITIN 28  TIBC 294  IRON 8*      Radiology Studies: I have reviewed all of the imaging during this hospital visit personally     Scheduled Meds: . sodium chloride   Intravenous Once  . enoxaparin (LOVENOX) injection  40 mg Subcutaneous Q24H  . ferrous sulfate  325 mg Oral Q breakfast  .   HYDROmorphone (DILAUDID) injection  1 mg Intravenous Once  . methylPREDNISolone (SOLU-MEDROL) injection  40 mg Intravenous Q12H   Continuous Infusions: . piperacillin-tazobactam (ZOSYN)  IV 3.375 g (12/31/17 0629)     LOS: 1 day        Mauricio Gerome Apley, MD [facility] Pager [phone]

## 2017-12-31 NOTE — Progress Notes (Signed)
CRITICAL VALUE ALERT  Critical Value:  Hemoglobin 6.1  Date & Time Notied:  12/31/17; 6:50am  Provider Notified: Schorr, NP  Orders Received/Actions taken: Will continue to monitor an pass on care to the day RN.

## 2017-12-31 NOTE — Care Management Note (Signed)
Case Management Note  Patient Details  Name: Richard Estrada MRN: [id number] Date of Birth: [birth year]  Subjective/Objective:       Sepsis, Ulcerative Colitis            Action/Plan: Patient recently moved from [place] to [place] to be with his mother; he does not remember the name of any MD's in [place] that he was seeing; no medical insurance; he is agreeable to go to the [facility] at discharge for follow up care; He is independent of all of his ADL; Financial Counselor to see the patient to determine what he might qualify for. CM will continue to follow for progression of care.   Expected Discharge Date:     Possibly 01/04/2018             Expected Discharge Plan:  Home/Self Care  In-House Referral:  Financial Counselor  Discharge planning Services  CM Consult  Status of Service:  In process, will continue to follow  Sherrilyn Rist [phone] 12/31/2017, 10:35 AM

## 2017-12-31 NOTE — Consult Note (Signed)
[facility] Consultation Note  Referring Provider: Dr. Cathlean Sauer [facility]) Primary Care Physician:  Patient, No Pcp Per  Reason for Consultation:  Abdominal pain, blood diarrhea, abnormal CT abdomen  HPI: Richard Estrada is a 27 y.o. male admitted for above reasons.  He was diagnosed with ulcerative pancolitis about 4 years ago in [place], had colonoscopy last about 2.5 years ago and what sounds like sigmoidoscopy about one year ago.  For unclear reasons, patient relays that he stayed on Lialda and prednisone for better part of a couple years, without duration steroid-free remission of symptoms, without consideration of biologic or other non-mesalamine therapies.  As outpatient, currently on no ulcerative colitis therapy.  He presents with abdominal pain and bloody diarrhea; CT consistent with pancolitis; dramatic improvement of symptoms on IV steroids which were started last night.   Past Medical History:  Diagnosis Date  . Ulcerative (chronic) enterocolitis ([place])     History reviewed. No pertinent surgical history.  Prior to Admission medications   Medication Sig Start Date End Date Taking? Authorizing Provider  IRON PO Take 1 tablet by mouth daily.   Yes [provider]  Multiple Vitamins-Minerals (MULTIVITAL PO) Take 1 tablet by mouth daily.   Yes [provider]    Current Facility-Administered Medications  Medication Dose Route Frequency Provider Last Rate Last Dose  . 0.9 %  sodium chloride infusion (Manually program via Guardrails IV Fluids)   Intravenous Once Arrien, Jimmy Picket, MD      . acetaminophen (TYLENOL) tablet 650 mg  650 mg Oral Q6H PRN Shela Leff, MD       Or  . acetaminophen (TYLENOL) suppository 650 mg  650 mg Rectal Q6H PRN Shela Leff, MD      . enoxaparin (LOVENOX) injection 40 mg  40 mg Subcutaneous Q24H Shela Leff, MD   40 mg at 12/30/17 2221  . ferrous sulfate tablet 325 mg  325 mg Oral Q breakfast Shela Leff, MD   325 mg at 12/31/17 1008  . HYDROmorphone (DILAUDID) injection 1 mg  1 mg Intravenous Once Shela Leff, MD      . methylPREDNISolone sodium succinate (SOLU-MEDROL) 40 mg/mL injection 40 mg  40 mg Intravenous Q12H Shela Leff, MD   40 mg at 12/31/17 1008  . piperacillin-tazobactam (ZOSYN) IVPB 3.375 g  3.375 g Intravenous Q8H Lyndee Leo, RPH 12.5 mL/hr at 12/31/17 0629 3.375 g at 12/31/17 0629    Allergies as of 12/30/2017  . (No Known Allergies)    No family history on file.  Social History   Socioeconomic History  . Marital status: Single    Spouse name: Not on file  . Number of children: Not on file  . Years of education: Not on file  . Highest education level: Not on file  Occupational History  . Not on file  Social Needs  . Financial resource strain: Not on file  . Food insecurity:    Worry: Not on file    Inability: Not on file  . Transportation needs:    Medical: Not on file    Non-medical: Not on file  Tobacco Use  . Smoking status: Never Smoker  . Smokeless tobacco: Never Used  Substance and Sexual Activity  . Alcohol use: Yes  . Drug use: Never  . Sexual activity: Not on file  Lifestyle  . Physical activity:    Days per week: Not on file    Minutes per session: Not on file  . Stress: Not on file  Relationships  . Social connections:    Talks on phone: Not on file    Gets together: Not on file    Attends religious service: Not on file    Active member of club or organization: Not on file    Attends meetings of clubs or organizations: Not on file    Relationship status: Not on file  . Intimate partner violence:    Fear of current or ex partner: Not on file    Emotionally abused: Not on file    Physically abused: Not on file    Forced sexual activity: Not on file  Other Topics Concern  . Not on file  Social History Narrative  . Not on file    Review of Systems: As per HPI, all others negative  Physical Exam: Vital  signs in last 24 hours: Temp:  [97.7 F (36.5 C)-100.7 F (38.2 C)] 97.8 F (36.6 C) (11/21 1220) Pulse Rate:  [71-142] 90 (11/21 1220) Resp:  [16-119] 16 (11/21 1220) BP: (106-120)/(64-84) 111/81 (11/21 1220) SpO2:  [99 %-100 %] 100 % (11/21 1220) Weight:  [66.7 kg-68.9 kg] 66.7 kg (11/21 0602) Last BM Date: 12/30/17 General:   Alert,  Well-developed, well-nourished, pleasant and cooperative in NAD Head:  Normocephalic and atraumatic. Eyes:  Sclera clear, no icterus.   Conjunctiva pink. Ears:  Normal auditory acuity. Nose:  No deformity, discharge,  or lesions. Mouth:  No deformity or lesions.  Oropharynx pink & moist. Neck:  Supple; no masses or thyromegaly. Lungs:  Clear throughout to auscultation.   No wheezes, crackles, or rhonchi. No acute distress. Heart:  Regular rate and rhythm; no murmurs, clicks, rubs,  or gallops. Abdomen:  Soft, nontender and nondistended. No masses, hepatosplenomegaly or hernias noted. Normal bowel sounds, without guarding, and without rebound.     Msk:  Symmetrical without gross deformities. Normal posture. Pulses:  Normal pulses noted. Extremities:  Without clubbing or edema. Neurologic:  Alert and  oriented x4;  grossly normal neurologically. Skin:  Intact without significant lesions or rashes. Cervical Nodes:  No significant cervical adenopathy. Psych:  Alert and cooperative. Normal mood and affect.   Lab Results: Recent Labs    12/30/17 1556 12/31/17 0510  WBC 17.8* 11.0*  HGB 8.2* 6.1*  HCT 32.0* 23.7*  PLT 1,393* 957*   BMET Recent Labs    12/30/17 1556 12/31/17 0510  NA 132* 132*  K 4.3 3.8  CL 100 100  CO2 21* 20*  GLUCOSE 103* 136*  BUN 9 9  CREATININE 0.99 1.05  CALCIUM 9.4 8.7*   LFT Recent Labs    12/30/17 1556  PROT 9.4*  ALBUMIN 3.3*  AST 14*  ALT 14  ALKPHOS 89  BILITOT 0.5   PT/INR No results for input(s): LABPROT, INR in the last 72 hours.  Studies/Results: Ct Abdomen Pelvis W Contrast  Result  Date: 12/30/2017 CLINICAL DATA:  Cough for 1 week EXAM: CT ABDOMEN AND PELVIS WITH CONTRAST TECHNIQUE: Multidetector CT imaging of the abdomen and pelvis was performed using the standard protocol following bolus administration of intravenous contrast. CONTRAST:  160mL OMNIPAQUE IOHEXOL 300 MG/ML  SOLN COMPARISON:  None. FINDINGS: Lower chest: No acute abnormality. Hepatobiliary: No focal liver abnormality is seen. No gallstones, gallbladder wall thickening, or biliary dilatation. Pancreas: Unremarkable. No pancreatic ductal dilatation or surrounding inflammatory changes. Spleen: Normal in size without focal abnormality. Adrenals/Urinary Tract: Adrenal glands are unremarkable. Kidneys are normal, without renal calculi, focal lesion, or hydronephrosis. Bladder is unremarkable. Stomach/Bowel: Normal stomach. No  pneumatosis, pneumoperitoneum or portal venous gas. Severe bowel wall thickening involving the descending and sigmoid colon with surrounding inflammatory changes. Bowel wall thickening involving the remainder of the colon. Air-fluid levels within the colon. Mild bowel wall thickening and distention of the terminal ileum. Mild thickening of appendix likely reactive. Vascular/Lymphatic: No significant vascular findings are present. No enlarged abdominal or pelvic lymph nodes. Reproductive: Prostate is unremarkable. Other: No abdominal wall hernia or abnormality. Small volume perihepatic ascites. Musculoskeletal: No acute osseous abnormality. No aggressive osseous lesion. IMPRESSION: 1. Pancolitis and mild terminal ileitis as can be seen with an infectious or inflammatory etiology. Pancolitis is most severe in the descending and sigmoid colon with severe surrounding inflammatory changes. Electronically Signed   By: Kathreen Devoid   On: 12/30/2017 18:19   Dg Chest Portable 1 View  Result Date: 12/30/2017 CLINICAL DATA:  Nausea.  Fever. EXAM: PORTABLE CHEST 1 VIEW COMPARISON:  None. FINDINGS: The heart size and  mediastinal contours are within normal limits. Both lungs are clear. The visualized skeletal structures are unremarkable. IMPRESSION: No active disease. Electronically Signed   By: Kerby Moors M.D.   On: 12/30/2017 16:48    Impression:  1.  Abdominal pain. 2.  Bloody diarrhea. 3.  Abnormal CT scan (pancolitis). 4.  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 5.  Overall constellation of findings most consistent with ulcerative colitis flare.  This diagnosis corroborated by patient's dramatic improvement on intravenous steroids.  Plan:  1.  Continue IV steroids another ~ 24 hours. 2.  Awaiting GI pathogen stool panel. 3.  Advance to full liquid diet. 4.  [facility] will revisit tomorrow; hopefully home in a couple more days; he will need outpatient GI follow-up and will need likely biologic maintenance therapy (e.g., anti-TNF or anti-integrin and anti-interleukin therapies); will go ahead and check HepB studies, quantiferon, CXR, in anticipation of this likelihood.   LOS: 1 day   Carney Saxton M  12/31/2017, 1:00 PM  Cell [phone] If no answer or after 5 PM call [phone]

## 2018-01-01 DIAGNOSIS — K51011 Ulcerative (chronic) pancolitis with rectal bleeding: Secondary | ICD-10-CM

## 2018-01-01 LAB — CBC WITH DIFFERENTIAL/PLATELET
Abs Immature Granulocytes: 0.08 10*3/uL — ABNORMAL HIGH (ref 0.00–0.07)
Basophils Absolute: 0 10*3/uL (ref 0.0–0.1)
Basophils Relative: 0 %
EOS ABS: 0 10*3/uL (ref 0.0–0.5)
EOS PCT: 0 %
HEMATOCRIT: 31.9 % — AB (ref 39.0–52.0)
Hemoglobin: 8.6 g/dL — ABNORMAL LOW (ref 13.0–17.0)
Immature Granulocytes: 1 %
LYMPHS ABS: 0.4 10*3/uL — AB (ref 0.7–4.0)
Lymphocytes Relative: 3 %
MCH: 19.1 pg — ABNORMAL LOW (ref 26.0–34.0)
MCHC: 27 g/dL — AB (ref 30.0–36.0)
MCV: 70.9 fL — ABNORMAL LOW (ref 80.0–100.0)
MONOS PCT: 2 %
Monocytes Absolute: 0.3 10*3/uL (ref 0.1–1.0)
Neutro Abs: 14.7 10*3/uL — ABNORMAL HIGH (ref 1.7–7.7)
Neutrophils Relative %: 94 %
Platelets: 1075 10*3/uL (ref 150–400)
RBC: 4.5 MIL/uL (ref 4.22–5.81)
RDW: 19.9 % — AB (ref 11.5–15.5)
WBC: 15.6 10*3/uL — ABNORMAL HIGH (ref 4.0–10.5)
nRBC: 0.2 % (ref 0.0–0.2)

## 2018-01-01 LAB — BASIC METABOLIC PANEL
ANION GAP: 8 (ref 5–15)
BUN: 8 mg/dL (ref 6–20)
CO2: 26 mmol/L (ref 22–32)
Calcium: 9.2 mg/dL (ref 8.9–10.3)
Chloride: 103 mmol/L (ref 98–111)
Creatinine, Ser: 0.82 mg/dL (ref 0.61–1.24)
GFR calc Af Amer: >60 mL/min (ref >60)
Glucose, Bld: 144 mg/dL — ABNORMAL HIGH (ref 70–99)
Potassium: 4.2 mmol/L (ref 3.5–5.1)
Sodium: 137 mmol/L (ref 135–145)

## 2018-01-01 LAB — TYPE AND SCREEN
ABO/RH(D): A POS
ANTIBODY SCREEN: NEGATIVE
UNIT DIVISION: 0
Unit division: 0

## 2018-01-01 LAB — BPAM RBC
BLOOD PRODUCT EXPIRATION DATE: 201912142359
BLOOD PRODUCT EXPIRATION DATE: 201912162359
ISSUE DATE / TIME: 201911211526
ISSUE DATE / TIME: 201911212011
Unit Type and Rh: 6200
Unit Type and Rh: 6200

## 2018-01-01 MED ORDER — PREDNISONE 20 MG PO TABS
40.0000 mg | ORAL_TABLET | Freq: Every day | ORAL | Status: DC
  Administered 2018-01-01 – 2018-01-02 (×2): 40 mg via ORAL
  Filled 2018-01-01 (×2): qty 2

## 2018-01-01 MED ORDER — SODIUM CHLORIDE 0.9 % IV SOLN
510.0000 mg | Freq: Once | INTRAVENOUS | Status: AC
  Administered 2018-01-01: 510 mg via INTRAVENOUS
  Filled 2018-01-01: qty 17

## 2018-01-01 NOTE — Progress Notes (Signed)
Pt had some bradycardia overnight when asleep, sustained in the 50's, dropped once to 49 non sustained, asymptomatic. MD notified.

## 2018-01-01 NOTE — Progress Notes (Signed)
Subjective: Diarrhea and blood in stool resolved.  Objective: Vital signs in last 24 hours: Temp:  [97.4 F (36.3 C)-98.7 F (37.1 C)] 97.5 F (36.4 C) (11/22 1500) Pulse Rate:  [56-95] 62 (11/22 1500) Resp:  [14-20] 19 (11/22 1500) BP: (102-116)/(69-82) 109/70 (11/22 1500) SpO2:  [99 %-100 %] 99 % (11/22 1500) Weight:  [66.2 kg] 66.2 kg (11/22 0451) Weight change: -2.722 kg Last BM Date: 12/31/17  PE: GEN:  NAD  Lab Results: CBC    Component Value Date/Time   WBC 15.6 (H) 01/01/2018 0636   RBC 4.50 01/01/2018 0636   HGB 8.6 (L) 01/01/2018 0636   HCT 31.9 (L) 01/01/2018 0636   PLT 1,075 (HH) 01/01/2018 0636   MCV 70.9 (L) 01/01/2018 0636   MCH 19.1 (L) 01/01/2018 0636   MCHC 27.0 (L) 01/01/2018 0636   RDW 19.9 (H) 01/01/2018 0636   LYMPHSABS 0.4 (L) 01/01/2018 0636   MONOABS 0.3 01/01/2018 0636   EOSABS 0.0 01/01/2018 0636   BASOSABS 0.0 01/01/2018 0636   CMP     Component Value Date/Time   NA 137 01/01/2018 0636   K 4.2 01/01/2018 0636   CL 103 01/01/2018 0636   CO2 26 01/01/2018 0636   GLUCOSE 144 (H) 01/01/2018 0636   BUN 8 01/01/2018 0636   CREATININE 0.82 01/01/2018 0636   CALCIUM 9.2 01/01/2018 0636   PROT 9.4 (H) 12/30/2017 1556   ALBUMIN 3.3 (L) 12/30/2017 1556   AST 14 (L) 12/30/2017 1556   ALT 14 12/30/2017 1556   ALKPHOS 89 12/30/2017 1556   BILITOT 0.5 12/30/2017 1556   GFRNONAA >60 01/01/2018 0636   GFRAA >60 01/01/2018 0636   GI path panel negative  Studies/Results: CXR negative  Assessment:  1.  UC flare, much improved.  Plan:  1.  Stop solumedrol, start prednisone. 2.  Advance diet. 3.  Likely home tomorrow, if doing ok on oral prednisone. 4.  Plan for outpatient Humira as soon as preliminary studies and approval obtained. 5.  Will arrange outpatient follow-up.   Landry Dyke 01/01/2018, 3:34 PM   Cell [phone] If no answer or after 5 PM call [phone]

## 2018-01-01 NOTE — Progress Notes (Signed)
PROGRESS NOTE    Richard Estrada  RXV:[id number] DOB: 1990-02-15 DOA: 12/30/2017 PCP: Patient, No Pcp Per    Brief Narrative:  27 year old male who presented with abdominal pain.  He does have significant past medical history for ulcerative colitis.  The day history of left lower abdominal pain, sharp and sharp in nature, intermittent, worse with supine position or movement.  No melena or hematochezia.  Patient not on any treatment for ulcerative colitis.  Physical examination he was febrile 100.7 F, pressure 110/73, heart rate 122, respiratory 23, oxygen saturation 99%.  Moist mucous membranes, lungs clear to auscultation bilaterally, heart S1-S2 present and rhythmic, abdomen tender in the left lower quadrant, no rebound or guarding, no lower extremity edema.  Sodium 132, potassium 4.3, chloride 100, bicarb 21, glucose 103, BUN 9, creatinine 0.99, white count 17.8, hemoglobin 8.2, hematocrit 32.0, platelets 1393.  Urinalysis negative for infection.  C. difficile negative.  CT of the abdomen with pancolitis and mild terminal ileitis.  More severe in the descending and sigmoid colon.  Chest x-ray negative for infiltrates.  EKG sinus tachycardia 146 bpm.  Patient was admitted to the hospital with a working diagnosis of sepsis due to colitis, ulcerative colitis flare.   Assessment & Plan:   Principal Problem:   Sepsis ([place]) Active Problems:   Inflammatory bowel disease (ulcerative colitis) ([place])   Thrombocytosis (HCC)   Hyponatremia   Chronic anemia  1.  Acute colitis, ulcerative colitis flare complicated with sepsis. Responding well to systemic steroids. Will advance diet, discontinue telemetry and will allow to ambulate. Today last day of antibiotic therapy if continue to improve clinically. Continue as needed analgesics and antiemetics.   2.  Thrombocytosis. Plt today up to 1,075, no signs of thrombosis, continue iron correction and will follow as outpatient. Suspected reactive.   3.   Hyponatremia. Na at 137, will advance diet as tolerated, renal function preserved, no nausea or vomiting, diarrhea is improving.   4.  Acute blood loss anemia on chronic anemia of iron deficiency. SP 2 units PRBC, with appropriate response, today Hb up to 8,6 with Hct 31,9, will continue to follow cell count. Patient sp IV iron infusion.   DVT prophylaxis: scd   Code Status:  full Family Communication: no family at the bedside  Disposition Plan/ discharge barriers: home pending clinical improvement.     Body mass index is 19.8 kg/m. Malnutrition Type:      Malnutrition Characteristics:      Nutrition Interventions:     RN Pressure Injury Documentation:     Consultants:   GI   Procedures:     Antimicrobials:   Zosyn     Subjective: Patient continue to improved, not yet back to baseline, no nausea or vomiting, persistent diarrhea and abdominal discomfort. No chest pain or dyspnea.   Objective: Vitals:   12/31/17 2036 12/31/17 2233 01/01/18 0029 01/01/18 0451  BP: 114/80 114/72 102/69 103/70  Pulse: 85 62 (!) 56 71  Resp: 20 18 18 18   Temp: 97.8 F (36.6 C) (!) 97.4 F (36.3 C) 98 F (36.7 C) 97.7 F (36.5 C)  TempSrc: Oral Oral Oral Oral  SpO2: 100% 100% 100% 100%  Weight:    66.2 kg  Height:        Intake/Output Summary (Last 24 hours) at 01/01/2018 0934 Last data filed at 01/01/2018 0534 Gross per 24 hour  Intake 625.51 ml  Output 1250 ml  Net -624.49 ml   Filed Weights   12/30/17 2112 12/31/17  0602 01/01/18 0451  Weight: 67.2 kg 66.7 kg 66.2 kg    Examination:   General: Not in pain or dyspnea, deconditioned  Neurology: Awake and alert, non focal  E ENT: positive pallor, no icterus, oral mucosa moist Cardiovascular: No JVD. S1-S2 present, rhythmic, no gallops, rubs, or murmurs. No lower extremity edema. Pulmonary: vesicular breath sounds bilaterally, adequate air movement, no wheezing, rhonchi or rales. Gastrointestinal. Abdomen  mild distended, no organomegaly, non tender, no rebound or guarding Skin. No rashes Musculoskeletal: no joint deformities     Data Reviewed: I have personally reviewed following labs and imaging studies  CBC: Recent Labs  Lab 12/30/17 1556 12/31/17 0510 01/01/18 0636  WBC 17.8* 11.0* 15.6*  NEUTROABS 15.1*  --  14.7*  HGB 8.2* 6.1* 8.6*  HCT 32.0* 23.7* 31.9*  MCV 67.9* 66.9* 70.9*  PLT 1,393* 957* 5,009*   Basic Metabolic Panel: Recent Labs  Lab 12/30/17 1556 12/31/17 0510 01/01/18 0636  NA 132* 132* 137  K 4.3 3.8 4.2  CL 100 100 103  CO2 21* 20* 26  GLUCOSE 103* 136* 144*  BUN 9 9 8   CREATININE 0.99 1.05 0.82  CALCIUM 9.4 8.7* 9.2   GFR: Estimated Creatinine Clearance: 127.8 mL/min (by C-G formula based on SCr of 0.82 mg/dL). Liver Function Tests: Recent Labs  Lab 12/30/17 1556  AST 14*  ALT 14  ALKPHOS 89  BILITOT 0.5  PROT 9.4*  ALBUMIN 3.3*   No results for input(s): LIPASE, AMYLASE in the last 168 hours. No results for input(s): AMMONIA in the last 168 hours. Coagulation Profile: No results for input(s): INR, PROTIME in the last 168 hours. Cardiac Enzymes: No results for input(s): CKTOTAL, CKMB, CKMBINDEX, TROPONINI in the last 168 hours. BNP (last 3 results) No results for input(s): PROBNP in the last 8760 hours. HbA1C: No results for input(s): HGBA1C in the last 72 hours. CBG: No results for input(s): GLUCAP in the last 168 hours. Lipid Profile: No results for input(s): CHOL, HDL, LDLCALC, TRIG, CHOLHDL, LDLDIRECT in the last 72 hours. Thyroid Function Tests: No results for input(s): TSH, T4TOTAL, FREET4, T3FREE, THYROIDAB in the last 72 hours. Anemia Panel: Recent Labs    12/31/17 0510  FERRITIN 28  TIBC 294  IRON 8*      Radiology Studies: I have reviewed all of the imaging during this hospital visit personally     Scheduled Meds: . enoxaparin (LOVENOX) injection  40 mg Subcutaneous Q24H  .  HYDROmorphone (DILAUDID)  injection  1 mg Intravenous Once  . methylPREDNISolone (SOLU-MEDROL) injection  40 mg Intravenous Q12H   Continuous Infusions: . sodium chloride 1,000 mL (12/31/17 1708)  . piperacillin-tazobactam (ZOSYN)  IV 3.375 g (01/01/18 0531)     LOS: 2 days        Mauricio Gerome Apley, MD [facility] Pager [phone]

## 2018-01-02 ENCOUNTER — Encounter (HOSPITAL_COMMUNITY): Payer: Self-pay

## 2018-01-02 DIAGNOSIS — K5100 Ulcerative (chronic) pancolitis without complications: Secondary | ICD-10-CM

## 2018-01-02 DIAGNOSIS — D473 Essential (hemorrhagic) thrombocythemia: Secondary | ICD-10-CM

## 2018-01-02 DIAGNOSIS — K51918 Ulcerative colitis, unspecified with other complication: Secondary | ICD-10-CM

## 2018-01-02 DIAGNOSIS — D649 Anemia, unspecified: Secondary | ICD-10-CM

## 2018-01-02 DIAGNOSIS — A419 Sepsis, unspecified organism: Principal | ICD-10-CM

## 2018-01-02 DIAGNOSIS — E871 Hypo-osmolality and hyponatremia: Secondary | ICD-10-CM

## 2018-01-02 LAB — CBC WITH DIFFERENTIAL/PLATELET
Abs Immature Granulocytes: 0.08 10*3/uL — ABNORMAL HIGH (ref 0.00–0.07)
Basophils Absolute: 0 10*3/uL (ref 0.0–0.1)
Basophils Relative: 0 %
EOS ABS: 0 10*3/uL (ref 0.0–0.5)
EOS PCT: 0 %
HCT: 29.8 % — ABNORMAL LOW (ref 39.0–52.0)
Hemoglobin: 8.2 g/dL — ABNORMAL LOW (ref 13.0–17.0)
IMMATURE GRANULOCYTES: 1 %
LYMPHS PCT: 4 %
Lymphs Abs: 0.7 10*3/uL (ref 0.7–4.0)
MCH: 19.7 pg — ABNORMAL LOW (ref 26.0–34.0)
MCHC: 27.5 g/dL — AB (ref 30.0–36.0)
MCV: 71.6 fL — ABNORMAL LOW (ref 80.0–100.0)
Monocytes Absolute: 0.8 10*3/uL (ref 0.1–1.0)
Monocytes Relative: 4 %
NEUTROS PCT: 91 %
NRBC: 0.1 % (ref 0.0–0.2)
Neutro Abs: 16 10*3/uL — ABNORMAL HIGH (ref 1.7–7.7)
Platelets: 1024 10*3/uL (ref 150–400)
RBC: 4.16 MIL/uL — AB (ref 4.22–5.81)
RDW: 20.6 % — AB (ref 11.5–15.5)
WBC: 17.5 10*3/uL — AB (ref 4.0–10.5)

## 2018-01-02 LAB — HEPATITIS B CORE ANTIBODY, IGM: HEP B C IGM: NEGATIVE

## 2018-01-02 LAB — BASIC METABOLIC PANEL
ANION GAP: 8 (ref 5–15)
BUN: 11 mg/dL (ref 6–20)
CALCIUM: 8.6 mg/dL — AB (ref 8.9–10.3)
CHLORIDE: 103 mmol/L (ref 98–111)
CO2: 23 mmol/L (ref 22–32)
CREATININE: 0.84 mg/dL (ref 0.61–1.24)
GFR calc non Af Amer: >60 mL/min (ref >60)
Glucose, Bld: 136 mg/dL — ABNORMAL HIGH (ref 70–99)
Potassium: 4 mmol/L (ref 3.5–5.1)
SODIUM: 134 mmol/L — AB (ref 135–145)

## 2018-01-02 LAB — HEPATITIS B SURFACE ANTIBODY,QUALITATIVE: HEP B S AB: NONREACTIVE

## 2018-01-02 LAB — HEPATITIS B CORE ANTIBODY, TOTAL: Hep B Core Total Ab: NEGATIVE

## 2018-01-02 MED ORDER — FERROUS SULFATE 325 (65 FE) MG PO TABS: 325.0000 mg | ORAL_TABLET | Freq: Every day | ORAL | 0 refills | Status: AC

## 2018-01-02 MED ORDER — PREDNISONE 20 MG PO TABS: 20.0000 mg | ORAL_TABLET | Freq: Every day | ORAL | 0 refills | Status: AC

## 2018-01-02 NOTE — Progress Notes (Signed)
Patient resting comfortably during shift report. Denies complaints.  

## 2018-01-02 NOTE — Progress Notes (Signed)
CRITICAL VALUE ALERT  Critical Value:  Platelets 1024  Date & Time Notied:  01/02/18 7:43 AM  Provider Notified: Arrien,MD  Orders Received/Actions taken: Awaiting callback from text-page.

## 2018-01-02 NOTE — Discharge Summary (Signed)
Physician Discharge Summary  Richard Estrada HKV:[id number] DOB: 1991/01/08 DOA: 12/30/2017  PCP: Patient, No Pcp Per  Admit date: 12/30/2017 Discharge date: 01/02/2018  Admitted From: Home  Disposition:  Home   Recommendations for Outpatient Follow-up and new medication changes:  1. Follow up with Dr. Paulita Fujita from GI as scheduled 2. Patient has been placed on prednisone 40 mg daily.  3. Continue daily Iron supplementation  4. Follow up with [facility] 01/20/2018  Home Health: no   Equipment/Devices: no    Discharge Condition: stable  CODE STATUS: full  Diet recommendation: Regular  Brief/Interim Summary: 27 year old male who presented with abdominal pain. He does have significant past medical history for ulcerative colitis.Patient reported  left lower abdominal pain, sharp and dull in nature, intermittent, worse with supine position or movement.No melena or hematochezia.Patient has been not on getting any treatment for ulcerative colitis.Physical examination he was febrile 100.7 F, blood pressure 110/73, heart rate 122, respiratory rate 23, oxygen saturation 99%.Moist mucous membranes, lungs clear to auscultation bilaterally, heart S1-S2 present and rhythmic, abdomen tender in the left lower quadrant, no rebound or guarding, no lower extremity edema.Sodium 132, potassium 4.3, chloride 100, bicarb 21, glucose 103, BUN 9, creatinine 0.99, white count 17.8, hemoglobin 8.2, hematocrit 32.0, platelets 1393.Urinalysis negative for infection.C. difficile negative. CT of the abdomen with pancolitis and mild terminal ileitis. More severe in the descending and sigmoid colon.Chest x-ray negative for infiltrates.EKG sinus tachycardia 146 bpm.  Patientwas admitted to the hospital with a working diagnosis of sepsis due to colitis, ulcerative colitis flare.  1.  Acute colitis, ulcerative colitis flare, complicated with sepsis, present on admission.   Patient was admitted to the medical ward, he was placed on remote telemetry monitor, received IV fluids with isotonic saline, IV antibiotic therapy with Zosyn, as needed IV analgesics and antiemetics.  He was placed on high-dose intravenous systemic steroids with improvement of his symptoms.  His diet was advanced with good toleration, antibiotics were discontinued and he was transitioned to oral prednisone.  Patient will follow-up as an outpatient with the GI clinic.  2.  Iron deficiency anemia with thrombocytosis.  His iron studies showed transferrin saturation of 3, iron 8, TIBC 294, ferritin 28.  Patient received IV iron, will continue oral iron supplementation as an outpatient.  His discharge platelets are 1,024 down from 1,075.   3.  Acute blood loss anemia due to lower GI bleed/colitis.  His nadir hemoglobin was 6.1/ hct 23.7. Patient received 2 units packed red blood cells, 8.2, hematocrit 29.8.   4.  HypoNatremia.  Likely due to dehydration, patient received IV isotonic saline, discharge sodium 134. Renal function remained preserved with serum cr at 0.84. K at 4,0.   Discharge Diagnoses:  Principal Problem:   Sepsis ([place]) Active Problems:   Inflammatory bowel disease (ulcerative colitis) ([place])   Thrombocytosis ([place])   Hyponatremia   Chronic anemia    Discharge Instructions   Allergies as of 01/02/2018   No Known Allergies     Medication List    TAKE these medications   ferrous sulfate 325 (65 FE) MG tablet Take 1 tablet (325 mg total) by mouth daily with breakfast. What changed:    medication strength  how much to take  when to take this   MULTIVITAL PO Take 1 tablet by mouth daily.   predniSONE 20 MG tablet Commonly known as:  DELTASONE Take 1 tablet (20 mg total) by mouth daily with breakfast. Start taking on:  01/03/2018      Follow-up Information    [facility]. Go on 01/20/2018.   Why:  @2 :30pm Contact information: [place] [address] [phone]         No Known Allergies  Consultations:  GI    Procedures/Studies: Dg Chest 2 View  Result Date: 12/31/2017 CLINICAL DATA:  No chest complaints.  Ulcer colitis. EXAM: CHEST - 2 VIEW COMPARISON:  12/30/2017 FINDINGS: The heart size and mediastinal contours are within normal limits. Both lungs are clear. The visualized skeletal structures are unremarkable. IMPRESSION: No active cardiopulmonary disease. Electronically Signed   By: Kathreen Devoid   On: 12/31/2017 13:53   Ct Abdomen Pelvis W Contrast  Result Date: 12/30/2017 CLINICAL DATA:  Cough for 1 week EXAM: CT ABDOMEN AND PELVIS WITH CONTRAST TECHNIQUE: Multidetector CT imaging of the abdomen and pelvis was performed using the standard protocol following bolus administration of intravenous contrast. CONTRAST:  116mL OMNIPAQUE IOHEXOL 300 MG/ML  SOLN COMPARISON:  None. FINDINGS: Lower chest: No acute abnormality. Hepatobiliary: No focal liver abnormality is seen. No gallstones, gallbladder wall thickening, or biliary dilatation. Pancreas: Unremarkable. No pancreatic ductal dilatation or surrounding inflammatory changes. Spleen: Normal in size without focal abnormality. Adrenals/Urinary Tract: Adrenal glands are unremarkable. Kidneys are normal, without renal calculi, focal lesion, or hydronephrosis. Bladder is unremarkable. Stomach/Bowel: Normal stomach. No pneumatosis, pneumoperitoneum or portal venous gas. Severe bowel wall thickening involving the descending and sigmoid colon with surrounding inflammatory changes. Bowel wall thickening involving the remainder of the colon. Air-fluid levels within the colon. Mild bowel wall thickening and distention of the terminal ileum. Mild thickening of appendix likely reactive. Vascular/Lymphatic: No significant vascular findings are present. No enlarged abdominal or pelvic lymph nodes. Reproductive: Prostate is unremarkable. Other: No  abdominal wall hernia or abnormality. Small volume perihepatic ascites. Musculoskeletal: No acute osseous abnormality. No aggressive osseous lesion. IMPRESSION: 1. Pancolitis and mild terminal ileitis as can be seen with an infectious or inflammatory etiology. Pancolitis is most severe in the descending and sigmoid colon with severe surrounding inflammatory changes. Electronically Signed   By: Kathreen Devoid   On: 12/30/2017 18:19   Dg Chest Portable 1 View  Result Date: 12/30/2017 CLINICAL DATA:  Nausea.  Fever. EXAM: PORTABLE CHEST 1 VIEW COMPARISON:  None. FINDINGS: The heart size and mediastinal contours are within normal limits. Both lungs are clear. The visualized skeletal structures are unremarkable. IMPRESSION: No active disease. Electronically Signed   By: Kerby Moors M.D.   On: 12/30/2017 16:48       Subjective: Patient is feeling better, no nausea or vomiting, no abdominal pain, no chest pain or dyspnea.   Discharge Exam: Vitals:   01/02/18 0522 01/02/18 0818  BP: 105/63 117/73  Pulse: (!) 49 (!) 53  Resp: 18   Temp: (!) 97.4 F (36.3 C) 97.7 F (36.5 C)  SpO2: 99% 100%   Vitals:   01/01/18 1500 01/01/18 1926 01/02/18 0522 01/02/18 0818  BP: 109/70 107/64 105/63 117/73  Pulse: 62 67 (!) 49 (!) 53  Resp: 19 18 18    Temp: (!) 97.5 F (36.4 C) 98.2 F (36.8 C) (!) 97.4 F (36.3 C) 97.7 F (36.5 C)  TempSrc: Oral Oral Oral Oral  SpO2: 99% 100% 99% 100%  Weight:   66.5 kg   Height:        General: Not in pain or dyspnea Neurology: Awake and alert, non focal  E ENT: mild pallor, no icterus,  oral mucosa moist Cardiovascular: No JVD. S1-S2 present, rhythmic, no gallops, rubs, or murmurs. No lower extremity edema. Pulmonary: vesicular breath sounds bilaterally, adequate air movement, no wheezing, rhonchi or rales. Gastrointestinal. Abdomen flat, no organomegaly, non tender, no rebound or guarding Skin. No rashes Musculoskeletal: no joint deformities   The results  of significant diagnostics from this hospitalization (including imaging, microbiology, ancillary and laboratory) are listed below for reference.     Microbiology: Recent Results (from the past 240 hour(s))  Blood Culture (routine x 2)     Status: None (Preliminary result)   Collection Time: 12/30/17  4:05 PM  Result Value Ref Range Status   Specimen Description BLOOD LEFT ANTECUBITAL  Final   Special Requests   Final    BOTTLES DRAWN AEROBIC AND ANAEROBIC Blood Culture adequate volume   Culture   Final    NO GROWTH 2 DAYS Performed at [facility], [address], [place], [place] [address]    Report Status PENDING  Incomplete  Blood Culture (routine x 2)     Status: None (Preliminary result)   Collection Time: 12/30/17  5:25 PM  Result Value Ref Range Status   Specimen Description BLOOD RIGHT ANTECUBITAL  Final   Special Requests   Final    BOTTLES DRAWN AEROBIC AND ANAEROBIC Blood Culture adequate volume   Culture   Final    NO GROWTH 2 DAYS Performed at [facility], [place] [address], [place], [place] [address]    Report Status PENDING  Incomplete  C difficile quick scan w PCR reflex     Status: None   Collection Time: 12/31/17  2:47 AM  Result Value Ref Range Status   C Diff antigen NEGATIVE NEGATIVE Final   C Diff toxin NEGATIVE NEGATIVE Final   C Diff interpretation No C. difficile detected.  Final    Comment: Performed at [facility], [place] [address], [place], [place] [address]  Gastrointestinal Panel by PCR , Stool     Status: None   Collection Time: 12/31/17  2:47 AM  Result Value Ref Range Status   Campylobacter species NOT DETECTED NOT DETECTED Final   Plesimonas shigelloides NOT DETECTED NOT DETECTED Final   Salmonella species NOT DETECTED NOT DETECTED Final   Yersinia enterocolitica NOT DETECTED NOT DETECTED Final   Vibrio species NOT DETECTED NOT DETECTED Final   Vibrio cholerae NOT DETECTED NOT DETECTED Final   Enteroaggregative E coli (EAEC)  NOT DETECTED NOT DETECTED Final   Enteropathogenic E coli (EPEC) NOT DETECTED NOT DETECTED Final   Enterotoxigenic E coli (ETEC) NOT DETECTED NOT DETECTED Final   Shiga like toxin producing E coli (STEC) NOT DETECTED NOT DETECTED Final   Shigella/Enteroinvasive E coli (EIEC) NOT DETECTED NOT DETECTED Final   Cryptosporidium NOT DETECTED NOT DETECTED Final   Cyclospora cayetanensis NOT DETECTED NOT DETECTED Final   Entamoeba histolytica NOT DETECTED NOT DETECTED Final   Giardia lamblia NOT DETECTED NOT DETECTED Final   Adenovirus F40/41 NOT DETECTED NOT DETECTED Final   Astrovirus NOT DETECTED NOT DETECTED Final   Norovirus GI/GII NOT DETECTED NOT DETECTED Final   Rotavirus A NOT DETECTED NOT DETECTED Final   Sapovirus (I, II, IV, and V) NOT DETECTED NOT DETECTED Final    Comment: Performed at [facility], [place] [place],  [address]     Labs: BNP (last 3 results) No results for input(s): BNP in the last 8760 hours. Basic Metabolic Panel: Recent Labs  Lab  12/30/17 1556 12/31/17 0510 01/01/18 0636 01/02/18 0628  NA 132* 132* 137 134*  K 4.3 3.8 4.2 4.0  CL 100 100 103 103  CO2 21* 20* 26 23  GLUCOSE 103* 136* 144* 136*  BUN 9 9 8 11   CREATININE 0.99 1.05 0.82 0.84  CALCIUM 9.4 8.7* 9.2 8.6*   Liver Function Tests: Recent Labs  Lab 12/30/17 1556  AST 14*  ALT 14  ALKPHOS 89  BILITOT 0.5  PROT 9.4*  ALBUMIN 3.3*   No results for input(s): LIPASE, AMYLASE in the last 168 hours. No results for input(s): AMMONIA in the last 168 hours. CBC: Recent Labs  Lab 12/30/17 1556 12/31/17 0510 01/01/18 0636 01/02/18 0628  WBC 17.8* 11.0* 15.6* 17.5*  NEUTROABS 15.1*  --  14.7* 16.0*  HGB 8.2* 6.1* 8.6* 8.2*  HCT 32.0* 23.7* 31.9* 29.8*  MCV 67.9* 66.9* 70.9* 71.6*  PLT 1,393* 957* 1,075* 1,024*   Cardiac Enzymes: No results for input(s): CKTOTAL, CKMB, CKMBINDEX, TROPONINI in the last 168 hours. BNP: Invalid input(s): POCBNP CBG: No results  for input(s): GLUCAP in the last 168 hours. D-Dimer No results for input(s): DDIMER in the last 72 hours. Hgb A1c No results for input(s): HGBA1C in the last 72 hours. Lipid Profile No results for input(s): CHOL, HDL, LDLCALC, TRIG, CHOLHDL, LDLDIRECT in the last 72 hours. Thyroid function studies No results for input(s): TSH, T4TOTAL, T3FREE, THYROIDAB in the last 72 hours.  Invalid input(s): FREET3 Anemia work up Recent Labs    12/31/17 0510  FERRITIN 28  TIBC 294  IRON 8*   Urinalysis    Component Value Date/Time   COLORURINE YELLOW 12/30/2017 [place] 12/30/2017 2246   LABSPEC >1.046 (H) 12/30/2017 2246   PHURINE 5.0 12/30/2017 2246   GLUCOSEU NEGATIVE 12/30/2017 2246   HGBUR NEGATIVE 12/30/2017 2246   BILIRUBINUR NEGATIVE 12/30/2017 2246   KETONESUR 5 (A) 12/30/2017 2246   PROTEINUR NEGATIVE 12/30/2017 2246   NITRITE NEGATIVE 12/30/2017 2246   LEUKOCYTESUR NEGATIVE 12/30/2017 2246   Sepsis Labs Invalid input(s): PROCALCITONIN,  WBC,  LACTICIDVEN Microbiology Recent Results (from the past 240 hour(s))  Blood Culture (routine x 2)     Status: None (Preliminary result)   Collection Time: 12/30/17  4:05 PM  Result Value Ref Range Status   Specimen Description BLOOD LEFT ANTECUBITAL  Final   Special Requests   Final    BOTTLES DRAWN AEROBIC AND ANAEROBIC Blood Culture adequate volume   Culture   Final    NO GROWTH 2 DAYS Performed at [facility], [place] [address] [place], [place] [address]    Report Status PENDING  Incomplete  Blood Culture (routine x 2)     Status: None (Preliminary result)   Collection Time: 12/30/17  5:25 PM  Result Value Ref Range Status   Specimen Description BLOOD RIGHT ANTECUBITAL  Final   Special Requests   Final    BOTTLES DRAWN AEROBIC AND ANAEROBIC Blood Culture adequate volume   Culture   Final    NO GROWTH 2 DAYS Performed at [facility], [place] [address], [place], [place] [address]    Report Status  PENDING  Incomplete  C difficile quick scan w PCR reflex     Status: None   Collection Time: 12/31/17  2:47 AM  Result Value Ref Range Status   C Diff antigen NEGATIVE NEGATIVE Final   C Diff toxin NEGATIVE NEGATIVE Final   C Diff interpretation No C. difficile  detected.  Final    Comment: Performed at [facility], [place] [address], [place], [place] [address]  Gastrointestinal Panel by PCR , Stool     Status: None   Collection Time: 12/31/17  2:47 AM  Result Value Ref Range Status   Campylobacter species NOT DETECTED NOT DETECTED Final   Plesimonas shigelloides NOT DETECTED NOT DETECTED Final   Salmonella species NOT DETECTED NOT DETECTED Final   Yersinia enterocolitica NOT DETECTED NOT DETECTED Final   Vibrio species NOT DETECTED NOT DETECTED Final   Vibrio cholerae NOT DETECTED NOT DETECTED Final   Enteroaggregative E coli (EAEC) NOT DETECTED NOT DETECTED Final   Enteropathogenic E coli (EPEC) NOT DETECTED NOT DETECTED Final   Enterotoxigenic E coli (ETEC) NOT DETECTED NOT DETECTED Final   Shiga like toxin producing E coli (STEC) NOT DETECTED NOT DETECTED Final   Shigella/Enteroinvasive E coli (EIEC) NOT DETECTED NOT DETECTED Final   Cryptosporidium NOT DETECTED NOT DETECTED Final   Cyclospora cayetanensis NOT DETECTED NOT DETECTED Final   Entamoeba histolytica NOT DETECTED NOT DETECTED Final   Giardia lamblia NOT DETECTED NOT DETECTED Final   Adenovirus F40/41 NOT DETECTED NOT DETECTED Final   Astrovirus NOT DETECTED NOT DETECTED Final   Norovirus GI/GII NOT DETECTED NOT DETECTED Final   Rotavirus A NOT DETECTED NOT DETECTED Final   Sapovirus (I, II, IV, and V) NOT DETECTED NOT DETECTED Final    Comment: Performed at [facility], [address], [place], [place] [address]     Time coordinating discharge: 45 minutes  SIGNED:   Tawni Millers, MD  [facility] 01/02/2018, 9:48 AM Pager [phone]  If 7PM-7AM, please contact  night-coverage [email or web address] Password [email or web address]

## 2018-01-04 LAB — CULTURE, BLOOD (ROUTINE X 2)
CULTURE: NO GROWTH
CULTURE: NO GROWTH
SPECIAL REQUESTS: ADEQUATE
Special Requests: ADEQUATE

## 2018-01-06 LAB — HEPATITIS B SURFACE ANTIGEN: Hepatitis B Surface Ag: NEGATIVE

## 2018-01-06 LAB — QUANTIFERON-TB GOLD PLUS

## 2018-01-20 ENCOUNTER — Inpatient Hospital Stay: Payer: Self-pay | Admitting: Nurse Practitioner

## 2019-12-24 IMAGING — CT CT ABD-PELV W/ CM
2 of 4 series · 16 of 46 positions shown, 18 images · IV contrast (omnipaque)
Comparison: None.

CLINICAL DATA: Cough for 1 week

EXAM:
CT ABDOMEN AND PELVIS WITH CONTRAST
TECHNIQUE: Multidetector CT imaging of the abdomen and pelvis was performed
using the standard protocol following bolus administration of
intravenous contrast.
CONTRAST:  100mL OMNIPAQUE IOHEXOL 300 MG/ML  SOLN

[Series 3: abd/ pelvis 5.0 i30f 2 · axial · 0.72mm/px · z∈[+741,+1176]mm · 13 of 95 slices shown, 15 images]
[im 4/95  soft-tissue]
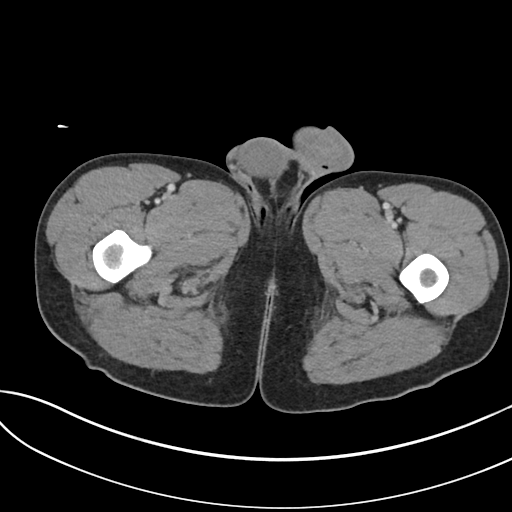
[im 4/95  bone]
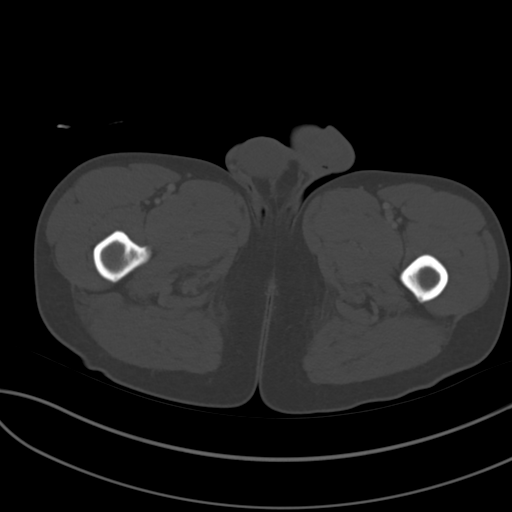
[im 12/95  soft-tissue]
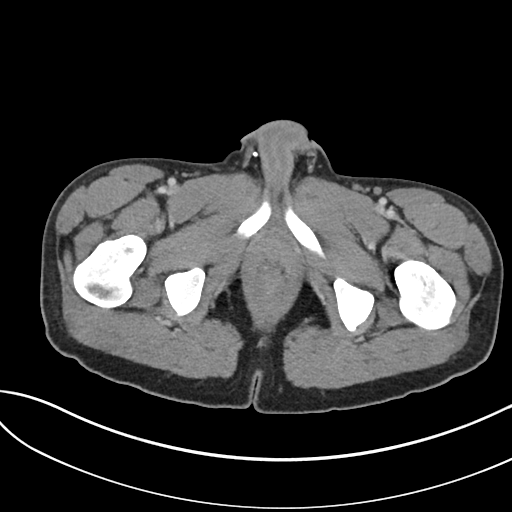
[im 19/95  soft-tissue]
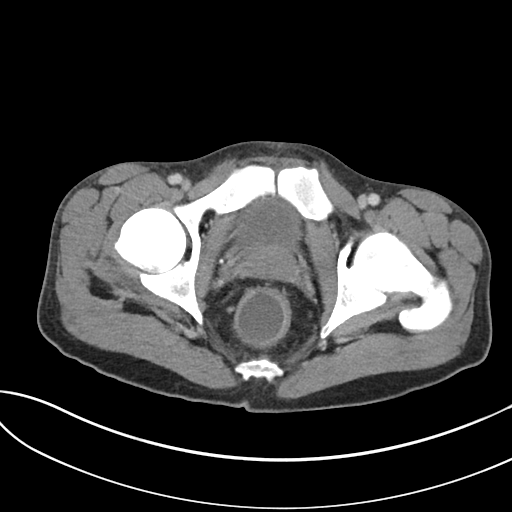
[im 27/95  soft-tissue]
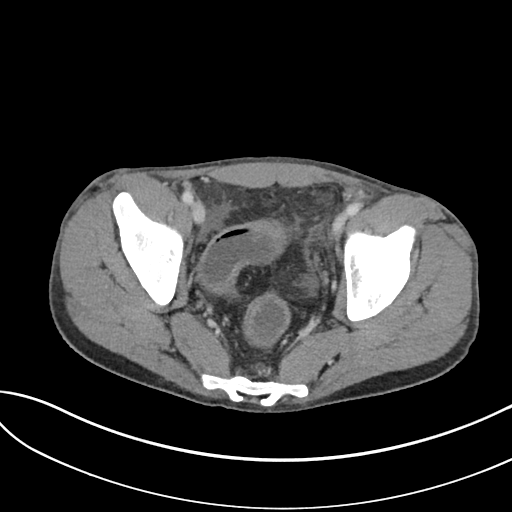
[im 34/95  soft-tissue]
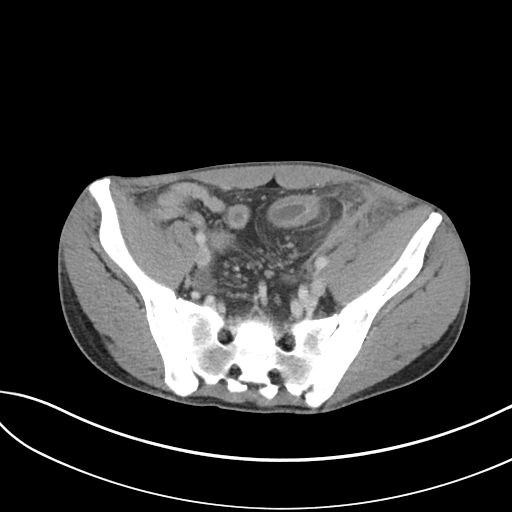
[im 42/95  soft-tissue]
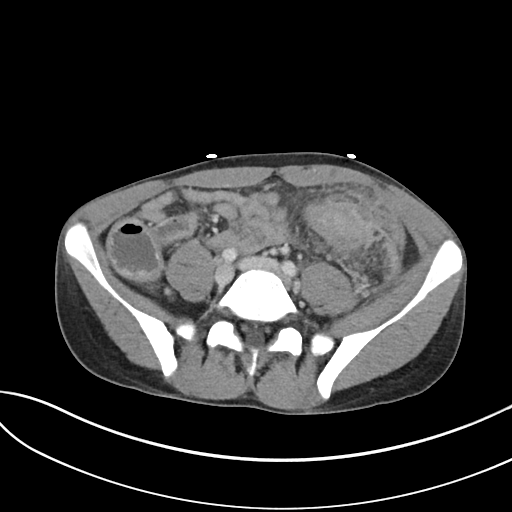
[im 49/95  soft-tissue]
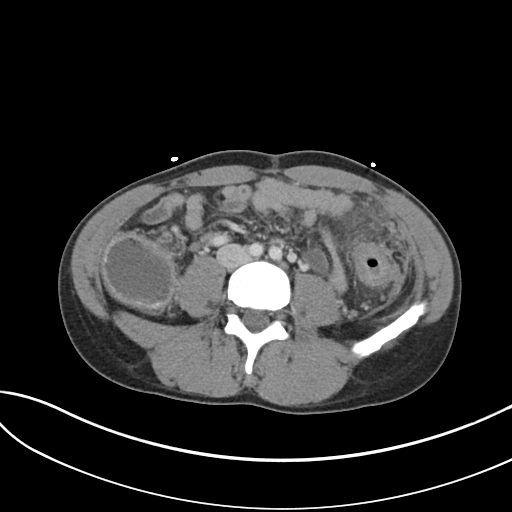
[im 53/95  soft-tissue]
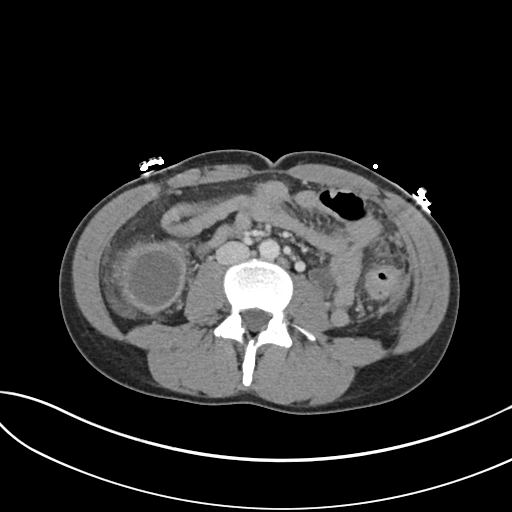
[im 61/95  soft-tissue]
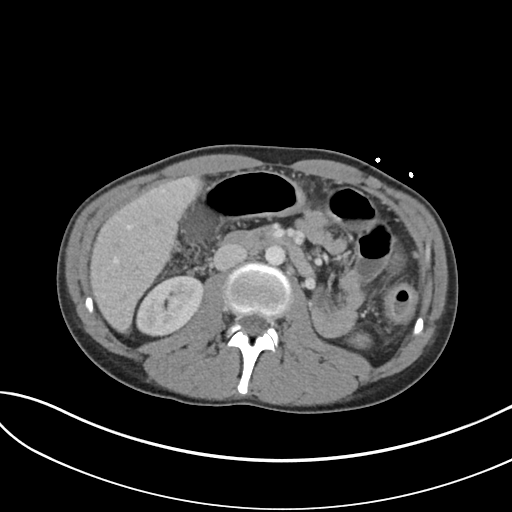
[im 61/95  bone]
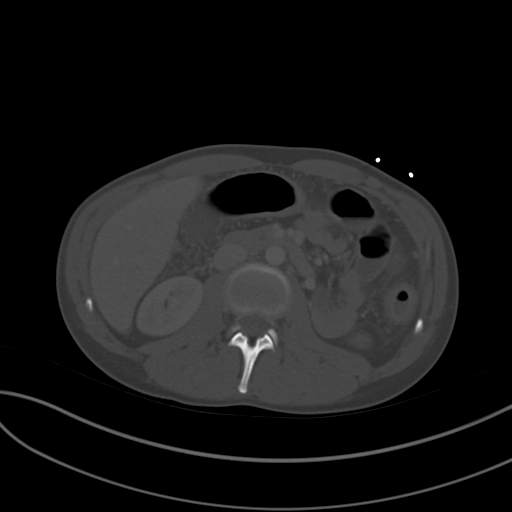
[im 68/95  soft-tissue]
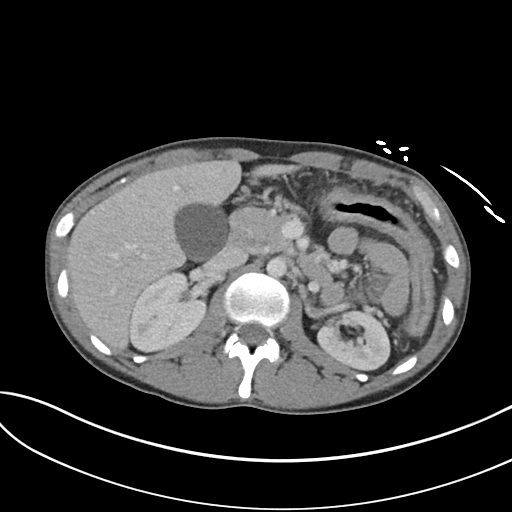
[im 76/95  soft-tissue]
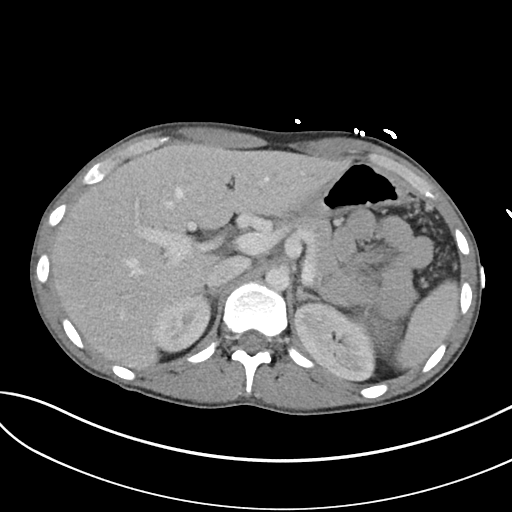
[im 83/95  soft-tissue]
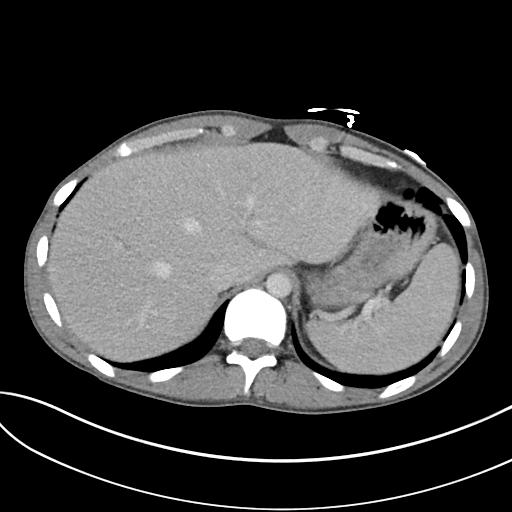
[im 91/95  soft-tissue]
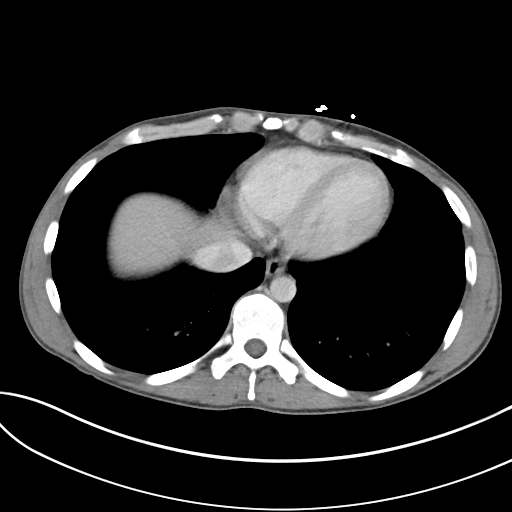

[Series 6: coronal soft tissue · coronal · 0.71mm/px · 3 of 86 slices shown]
[im 29/86  soft-tissue]
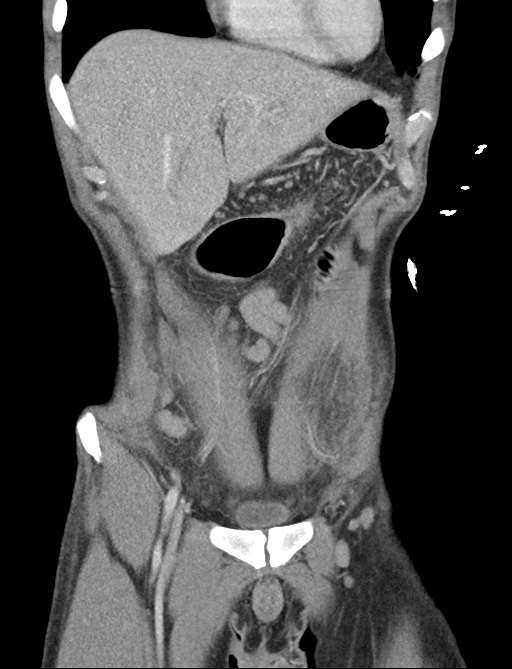
[im 38/86  soft-tissue]
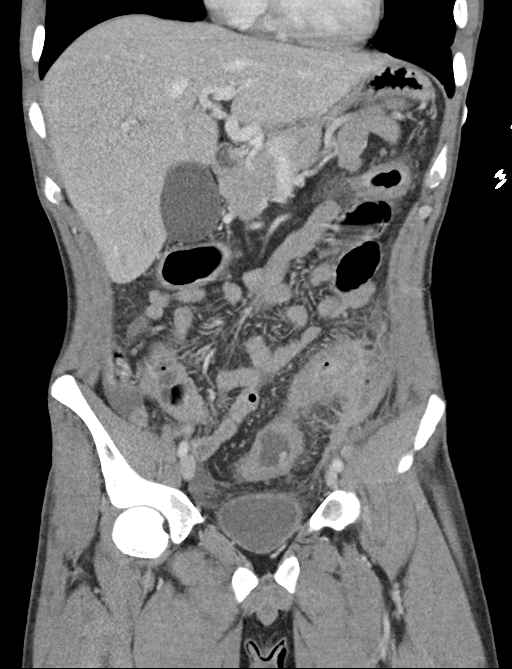
[im 48/86  soft-tissue]
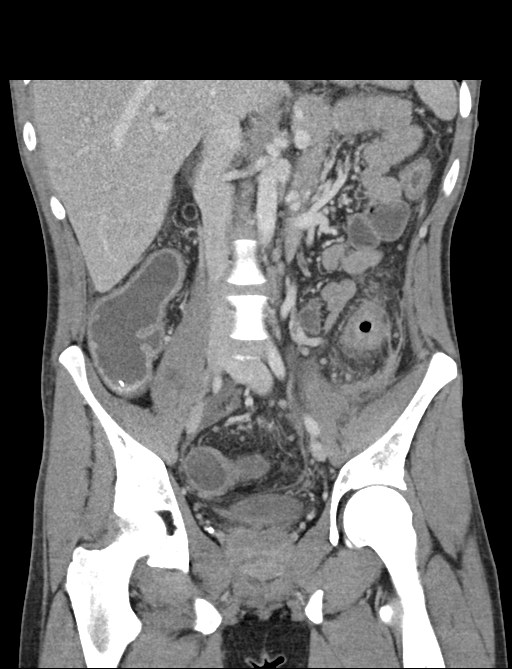

[16 of 46 positions shown; findings below may reference images not displayed]

FINDINGS: Lower chest: No acute abnormality.

Hepatobiliary: No focal liver abnormality is seen. No gallstones,
gallbladder wall thickening, or biliary dilatation.

Pancreas: Unremarkable. No pancreatic ductal dilatation or
surrounding inflammatory changes.

Spleen: Normal in size without focal abnormality.

Adrenals/Urinary Tract: Adrenal glands are unremarkable. Kidneys are
normal, without renal calculi, focal lesion, or hydronephrosis.
Bladder is unremarkable.

Stomach/Bowel: Normal stomach. No pneumatosis, pneumoperitoneum or
portal venous gas. Severe bowel wall thickening involving the
descending and sigmoid colon with surrounding inflammatory changes.
Bowel wall thickening involving the remainder of the colon.
Air-fluid levels within the colon. Mild bowel wall thickening and
distention of the terminal ileum. Mild thickening of appendix likely
reactive.

Vascular/Lymphatic: No significant vascular findings are present. No
enlarged abdominal or pelvic lymph nodes.

Reproductive: Prostate is unremarkable.

Other: No abdominal wall hernia or abnormality. Small volume
perihepatic ascites.

Musculoskeletal: No acute osseous abnormality. No aggressive osseous
lesion.
IMPRESSION: 1. Pancolitis and mild terminal ileitis as can be seen with an
infectious or inflammatory etiology. Pancolitis is most severe in
the descending and sigmoid colon with severe surrounding
inflammatory changes.

## 2019-12-25 IMAGING — DX DG CHEST 2V
2 series · 2 of 2 positions shown · non-contrast
Comparison: 12/30/2017

CLINICAL DATA: No chest complaints.  Ulcer colitis.

EXAM:
CHEST - 2 VIEW

[w chest pa]
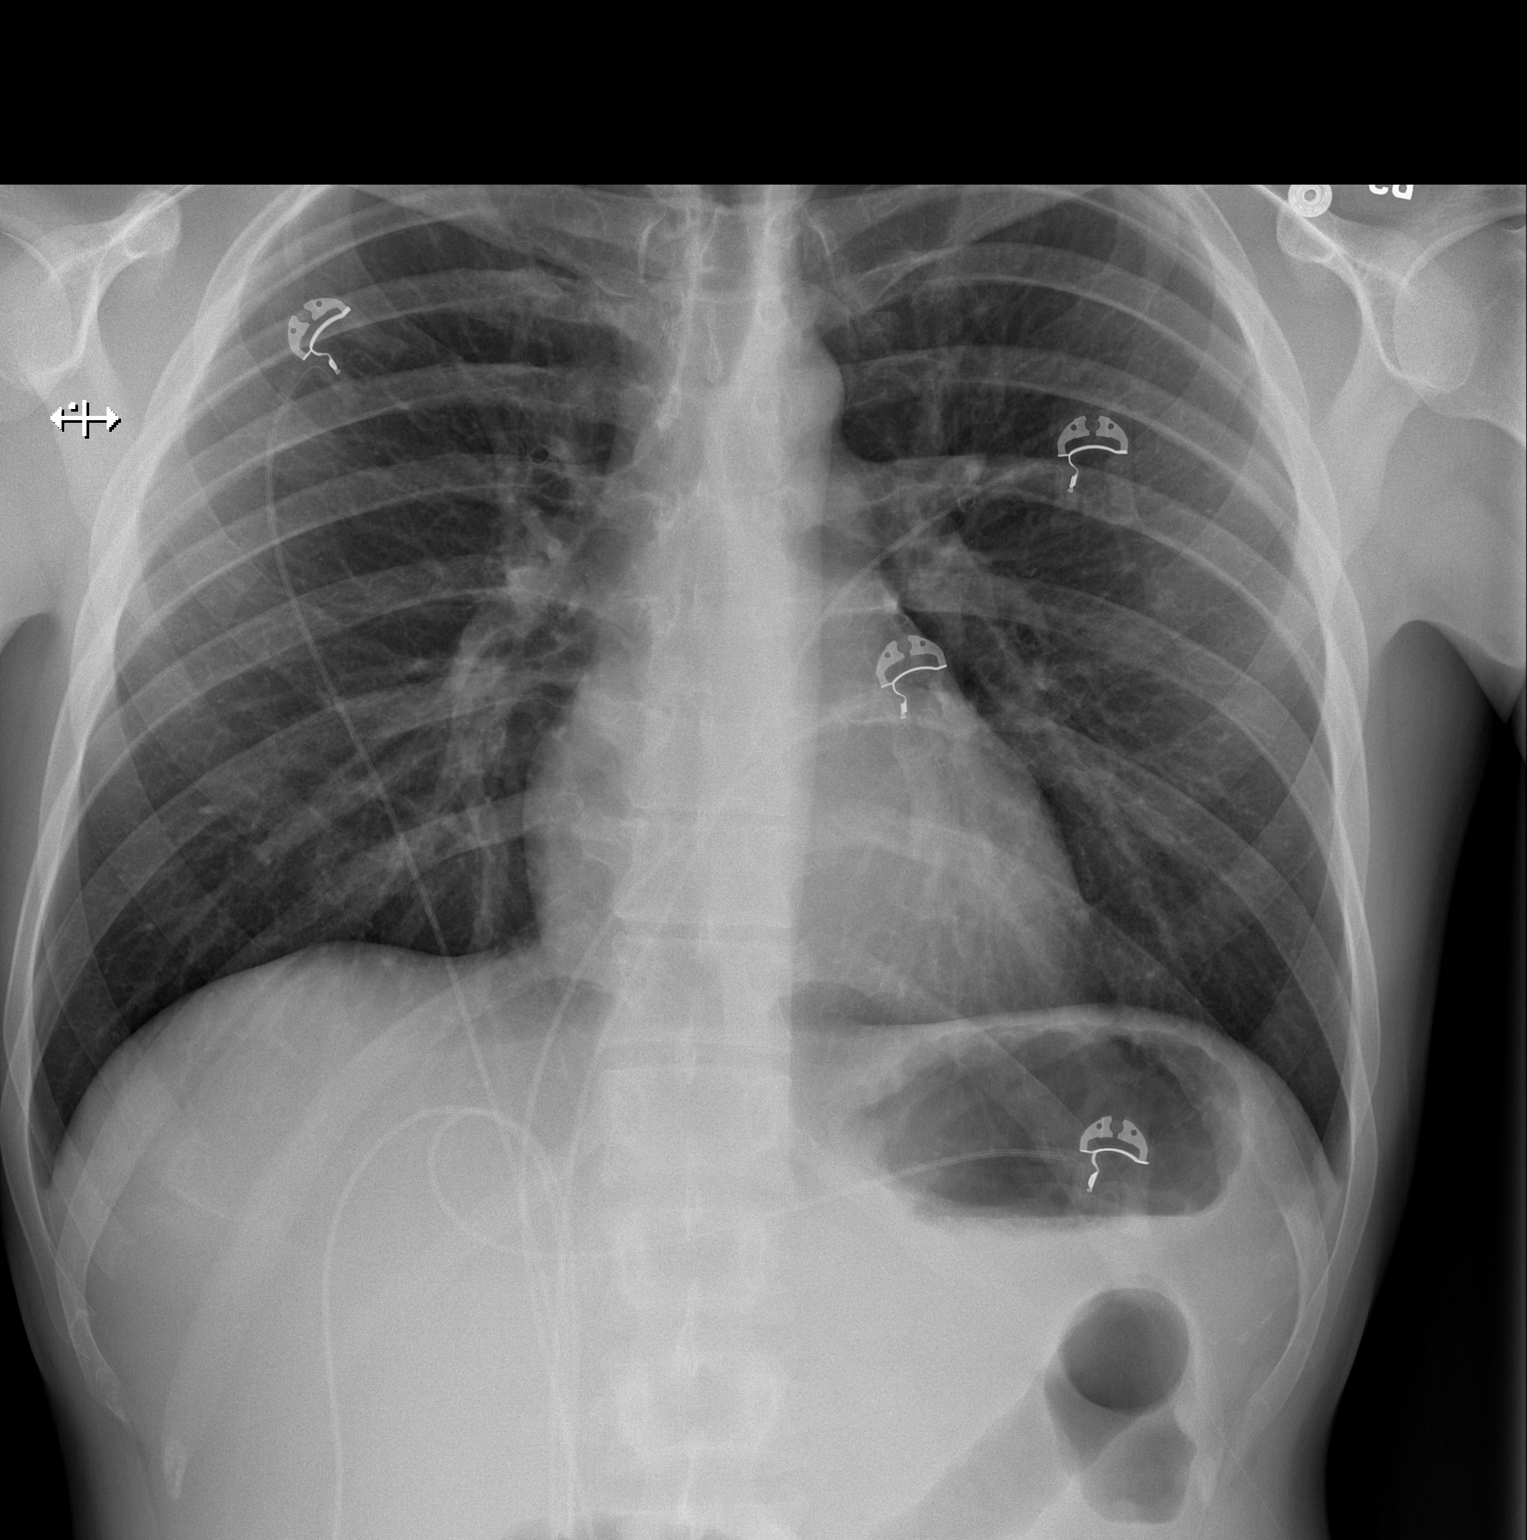

[w chest lat]
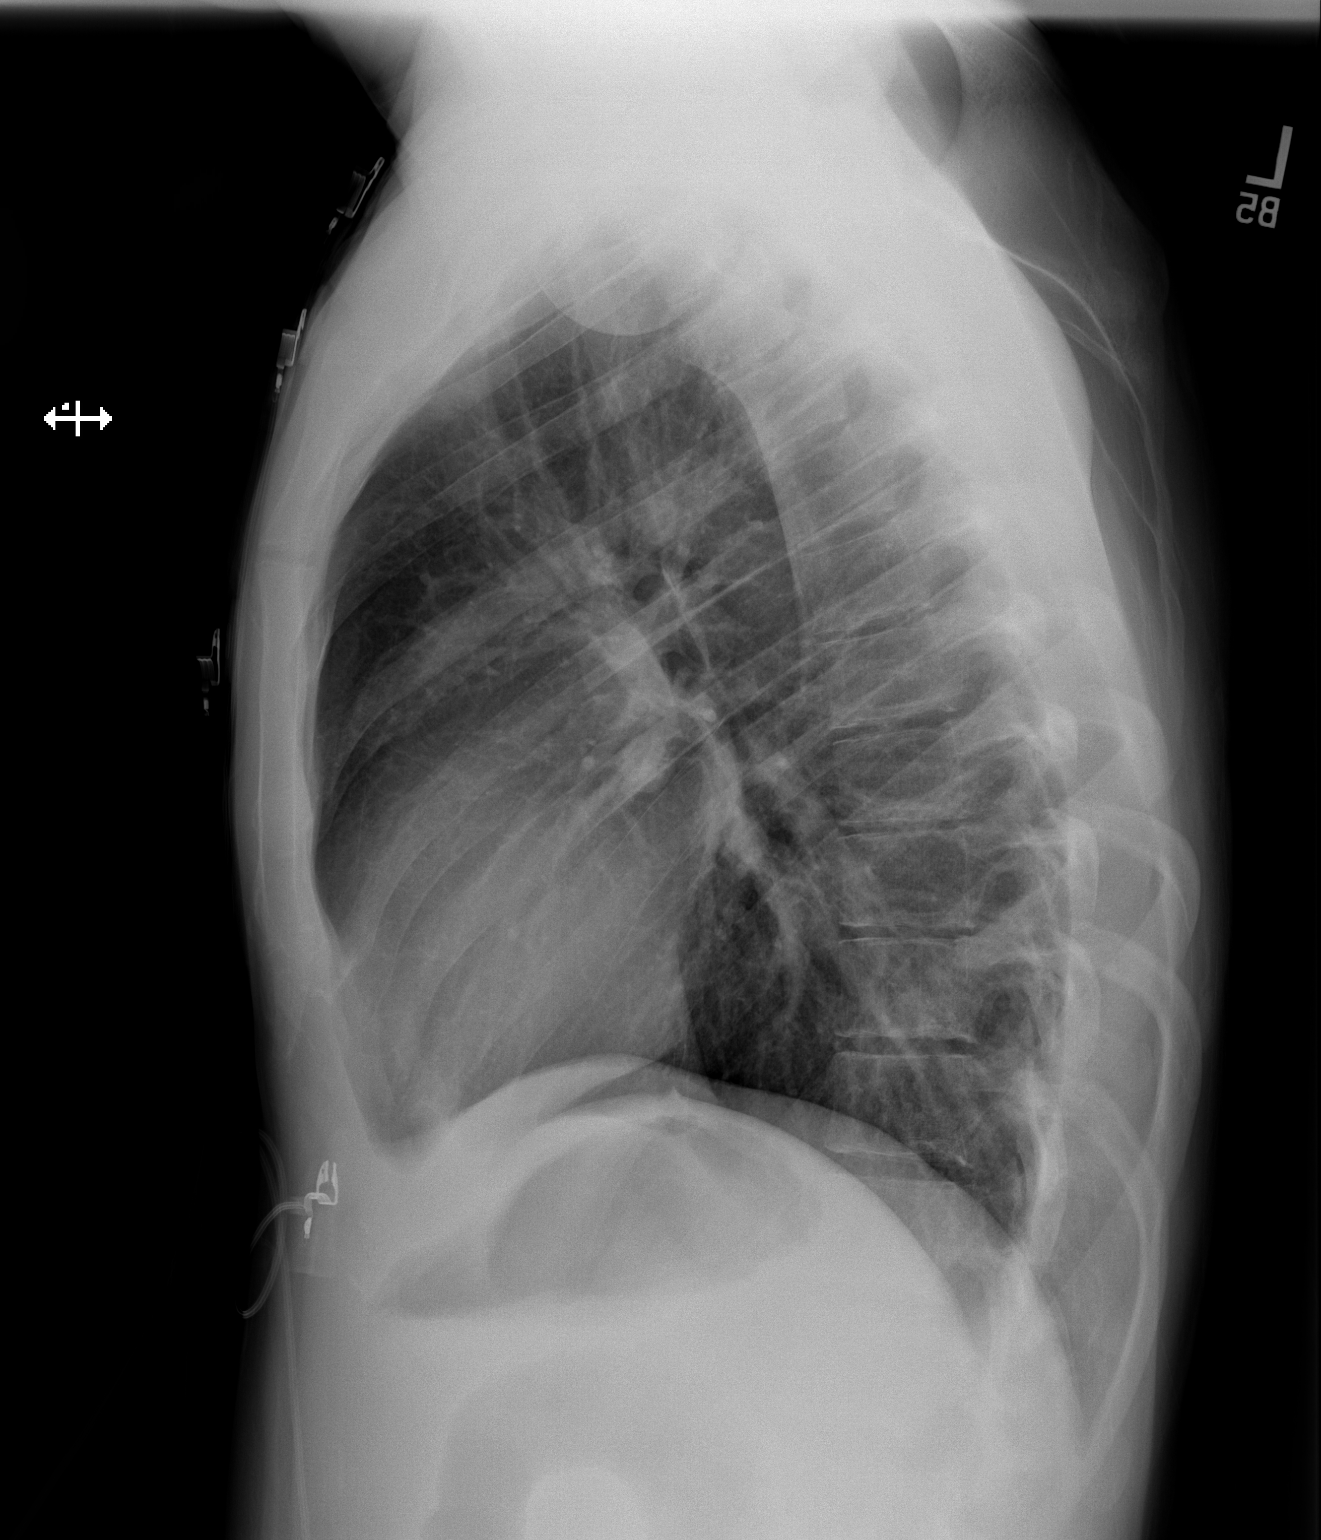

[2 of 2 positions shown; findings below may reference images not displayed]

FINDINGS: The heart size and mediastinal contours are within normal limits.
Both lungs are clear. The visualized skeletal structures are
unremarkable.
IMPRESSION: No active cardiopulmonary disease.
